# Patient Record
Sex: Male | Born: 1950 | Race: White | Hispanic: No | Marital: Married | Smoking: Former smoker
Health system: Southern US, Community
[De-identification: ages and names within clinical notes are randomized; demographics above are authoritative.]

## PROBLEM LIST (undated history)

## (undated) DIAGNOSIS — I10 Essential (primary) hypertension: Secondary | ICD-10-CM

## (undated) DIAGNOSIS — M1990 Unspecified osteoarthritis, unspecified site: Secondary | ICD-10-CM

## (undated) DIAGNOSIS — E559 Vitamin D deficiency, unspecified: Secondary | ICD-10-CM

## (undated) DIAGNOSIS — K635 Polyp of colon: Secondary | ICD-10-CM

## (undated) DIAGNOSIS — C801 Malignant (primary) neoplasm, unspecified: Secondary | ICD-10-CM

## (undated) DIAGNOSIS — R7303 Prediabetes: Secondary | ICD-10-CM

## (undated) DIAGNOSIS — J309 Allergic rhinitis, unspecified: Secondary | ICD-10-CM

## (undated) DIAGNOSIS — E785 Hyperlipidemia, unspecified: Secondary | ICD-10-CM

## (undated) DIAGNOSIS — K5790 Diverticulosis of intestine, part unspecified, without perforation or abscess without bleeding: Secondary | ICD-10-CM

## (undated) HISTORY — PX: KNEE ARTHROSCOPY: SUR90

---

## 2001-09-30 ENCOUNTER — Ambulatory Visit (HOSPITAL_BASED_OUTPATIENT_CLINIC_OR_DEPARTMENT_OTHER): Admission: RE | Admit: 2001-09-30 | Discharge: 2001-09-30 | Payer: Self-pay | Admitting: Orthopaedic Surgery

## 2004-09-14 ENCOUNTER — Inpatient Hospital Stay (HOSPITAL_COMMUNITY): Admission: RE | Admit: 2004-09-14 | Discharge: 2004-09-17 | Payer: Self-pay | Admitting: Orthopaedic Surgery

## 2006-04-04 ENCOUNTER — Ambulatory Visit (HOSPITAL_COMMUNITY): Admission: RE | Admit: 2006-04-04 | Discharge: 2006-04-04 | Payer: Self-pay | Admitting: Urology

## 2006-04-15 ENCOUNTER — Ambulatory Visit: Payer: Self-pay | Admitting: Oncology

## 2006-04-18 ENCOUNTER — Encounter (HOSPITAL_COMMUNITY): Admission: RE | Admit: 2006-04-18 | Discharge: 2006-07-17 | Payer: Self-pay | Admitting: Urology

## 2006-04-29 ENCOUNTER — Ambulatory Visit: Admission: RE | Admit: 2006-04-29 | Discharge: 2006-08-21 | Payer: Self-pay | Admitting: Radiation Oncology

## 2006-07-22 ENCOUNTER — Ambulatory Visit: Admission: RE | Admit: 2006-07-22 | Discharge: 2006-08-21 | Payer: Self-pay | Admitting: Radiation Oncology

## 2010-06-09 NOTE — Op Note (Signed)
NAME:  Steve [name], Steve [name]                ACCOUNT NO.:  [id number]   MEDICAL RECORD NO.:  [id number]          PATIENT TYPE:  INP   LOCATION:  2866                         FACILITY:  [facility]   PHYSICIAN:  Lubertha Basque. Dalldorf, M.D.DATE OF BIRTH:  [birth year]   DATE OF PROCEDURE:  09/14/2004  DATE OF DISCHARGE:                                 OPERATIVE REPORT   PREOPERATIVE DIAGNOSIS:  Left knee degenerative arthritis.   POSTOPERATIVE DIAGNOSIS:  Left knee degenerative arthritis.   PROCEDURE:  Left total knee replacement.   ANESTHESIA:  General and femoral nerve block.   ATTENDING SURGEON:  Lubertha Basque. Jerl Santos, M.D.   FIRST ASSISTANT:  1.  Charlesetta Shanks, M.D. and [place], [place].   INDICATIONS FOR PROCEDURE:  The patient is a 60 year old male with long  history of bilateral knee pain and x-rays consistent with end-stage  degenerative change of medial compartment with some mild varus deformities.  He has persisted with difficulty walking and resting despite use of oral  anti-inflammatories and multiple injectables. He has pain which limits his  lifestyle and he is offered knee replacement surgery at this time with the  first side being the right.  We intend to perform surgery the opposite side  eventually. Informed operative consent was obtained after discussion of  possible complications of reaction to anesthesia, infection, DVT, PE, and  death.   DESCRIPTION OF PROCEDURE:  The patient was taken to an  operating suite  where general anesthetic was applied without difficulty.  He was also given  a block in the preanesthesia area. He was positioned supine and prepped and  draped in normal sterile fashion. After administration of preop IV  antibiotics, the left leg was elevated, exsanguinated, tourniquet inflated  about the thigh. All appropriate anti-infective measures were used including  preoperative IV antibiotic, Betadine impregnated drape, and closed hooded  exhaust systems for each member of  the surgical team. A longitudinal  anterior incision was made with dissection down the extensor mechanism. A  medial parapatellar incision was made in this structure. The kneecap was  then flipped and the knee flexed. He had severe degenerative change actually  in all three compartments and excellent bone quality. The ACL and PCL were  removed. Some residual meniscal tissues were removed. A guide was placed on  the tibia in order to create a cut roughly parallel with the floor with a  slight posterior tilt. An intramedullary guide was then placed in the femur  in order to create anterior and posterior cuts, making a flexion gap of  about 10 mm. A second intramedullary guide was placed in the femur order to  make a distal cut creating an equal extension gap of 10 mm balancing the  knee. The femur sized to a large component while the tibia sized to a 4.  Appropriate guides placed here and utilized. The patella was cut down in  thickness from 25 to about 16 and sized to a 41 with the appropriate guide  placed and utilized. A trial reduction was done with all these components.  He easily came to slight hyperextension  and flexed well. The kneecap tracked  well and no lateral release was required. The trial components were removed  followed by pulsatile lavage irrigation of all three cut bony surfaces.  Cement was mixed including antibiotic. This was pressurized onto the three  bones. This followed by placement of the DePuy LCS components which were  large left femur, size 4 tibia, large 10 mm deep dish insert, and 41 mm all  polyethylene patella. Excess cement was trimmed and pressure was held on the  components until cement hardened. The tourniquet was then deflated and a  small amount bleeding was easily controlled with Bovie cautery. The wound  was irrigated followed by placement of a drain exiting superolaterally. The  extensor mechanism was reapproximated with #1 Vicryl interrupted fashion   followed by subcutaneous reapproximation with 0 and 2-0 undyed Vicryl and  skin closure with staples. The knee easily flexed to 120 degrees against  gravity at the end of the case and he came to slight have hyperextension as  well. Adaptic was applied to the wound followed by dry gauze and loose Ace  wrap. Estimated blood loss was 50 mL and intraoperative fluids can be  obtained from anesthesia records as can accurate tourniquet time.   DISPOSITION:  The patient was extubated in the operating room and taken to  recovery in stable condition. Plans were for him to be admitted orthopedic  surgery service for appropriate postop care to include perioperative  antibiotics and Coumadin plus Lovenox for DVT prophylaxis.      Lubertha Basque Jerl Santos, M.D.  Electronically Signed     PGD/MEDQ  D:  09/14/2004  T:  09/15/2004  Job:  [id number]

## 2010-06-09 NOTE — Op Note (Signed)
NAME:  Steve [name], Steve [name]                            ACCOUNT NO.:  [id number]   MEDICAL RECORD NO.:  [id number]                   PATIENT TYPE:  AMB   LOCATION:  DSC                                  FACILITY:  [facility]   PHYSICIAN:  Lubertha Basque. Jerl Santos, M.D.             DATE OF BIRTH:  Apr 09, 1950   DATE OF PROCEDURE:  09/30/2001  DATE OF DISCHARGE:                                 OPERATIVE REPORT   PREOPERATIVE DIAGNOSIS:  Bilateral knee degenerative joint disease and torn  meniscus.   POSTOPERATIVE DIAGNOSIS::  Bilateral degenerative joint disease and torn  meniscus.   PROCEDURES:  1. Right knee partial medial meniscectomy.  2. Right knee chondroplasty.  3. Left knee partial medial meniscectomy.  4. Left knee chondroplasty.  5. Left knee removal of loose body.   ANESTHESIA:  General.   SURGEON:  Lubertha Basque. Jerl Santos, M.D.   ASSISTANT:  Prince Rome, P.A.   INDICATION FOR PROCEDURE::  The patient is a 60 year old man with a many-  year history of bilateral knee pain and swelling.  This has persisted  despite oral anti-inflammatories and multiple injections of cortisone and  Synvisc.  He has been left with mechanical symptoms in his knee, which  interrupt his sleep and activity.  He is offered an arthroscopy on both  sides.  The procedures were discussed with the patient and informed  operative consent was obtained after discussion of the possible  complications of, reaction to anesthesia, and infection.   DESCRIPTION OF PROCEDURE:  The patient was taken to the operating suite,  where a general anesthetic was applied without difficulty.  He was  positioned supine and prepped and draped in normal sterile fashion.  After  the administration of preop IV antibiotics, an arthroscopy of the right knee  was performed through two inferior portals.  The suprapatellar pouch was  benign, although the patellofemoral joint exhibited some grade 1 and 2  chondromalacia.  In the medial  compartment he had end-stage degenerative  change, grade 4, on both aspects.  He had torn medial meniscus, which was  addressed with a partial medial meniscectomy and taking about 10% of the  structure back to a stable rim.  ACL was intact.  Lateral compartment  exhibited grade 3 and small areas of grade 4 change with an intact meniscus.  The knee was thoroughly irrigated, followed by placement of Marcaine with  epinephrine and morphine.  I also added some Depo-Medrol.  It should be  noted that a chondroplasty was done of all three compartments on this side.  We then turned out attention to the left knee, and a nearly identical  procedure was performed here through two inferior portals.  On this side he  had identical findings, but he also had a large loose body in the lateral  compartment, addressed with removal.  This knee was thoroughly irrigated and  injected with similar agents.  Adaptic was placed over the two portals,  followed by dry gauze and loose Ace wraps.  Estimated blood loss and  intraoperative fluids can be obtained from the anesthesia records.    DISPOSITION:  The patient was extubated in the operating room and taken to  the recovery room in stable condition.  The plan is for him to go home the  same day and follow up in the office in less than a week.  I will contact  him by phone tonight.                                                Lubertha Basque Jerl Santos, M.D.    PGD/MEDQ  D:  09/30/2001  T:  09/30/2001  Job:  [id number]

## 2010-06-09 NOTE — Discharge Summary (Signed)
NAME:  Steve [name], Steve [name]                ACCOUNT NO.:  [id number]   MEDICAL RECORD NO.:  [id number]          PATIENT TYPE:  INP   LOCATION:  5021                         FACILITY:  [facility]   PHYSICIAN:  Steve Basque. Dalldorf, M.D.DATE OF BIRTH:  [birth year]   DATE OF ADMISSION:  09/14/2004  DATE OF DISCHARGE:  09/17/2004                                 DISCHARGE SUMMARY   ADMISSION DIAGNOSES:  1.  Left knee end-stage degenerative joint disease.  2.  Hypertension.  3.  Hypercholesterolemia.   DISCHARGE DIAGNOSES:  1.  Left knee end-stage degenerative joint disease.  2.  Hypertension.  3.  Hypercholesterolemia.  4.  Deep venous thrombosis prophylaxis with Coumadin.   BRIEF HISTORY:  Steve [name] is a 60 year old white male who is a patient well  known to our practice. He is complaining of bilateral knee pain, but the  left is greater than the right. He has had knee arthroscopies done in the  past which did reveal some significant degenerative change and over time his  symptoms have worsened. Recent x-ray show end-stage DJD of his left knee  worse than his right knee. He is now having pain with every step, trouble  sleeping at night time. He has failed Synvisc and corticosteroid injections.  We have discussed treatment options with him that being knee replacement  surgery and the risks of anesthesia, infection, DVT, and possible death.   PERTINENT LABORATORY AND X-RAY FINDINGS:  Normal sinus rhythm on EKG. WBC  8.5, hemoglobin 11.7, hematocrit 33.3, platelet count 221,000. INR 1.4.  Sodium 135, potassium 3.7, glucose 130, BUN 14, creatinine 1.1.   COURSE IN THE HOSPITAL:  The patient was admitted postoperatively. He was on  an IV of lactated ringers, IV Ancef 1 gm q.6h. times three doses. Colace,  Trinsicon, laxative of choice were all ordered. Coumadin and Lovenox  protocol per pharmacy for DVT prophylaxis. He was also put on oral pain  medication, Percocet and Phenergan for nausea, Skelaxin  for muscle  relaxation if necessary, and kept on some medications of lisinopril 20/25  and lovastatin 20 mg. Ice to his left knee, incentive spirometry, knee-high  TEDs, and in-and-out catheter as needed. Knee immobilizer to his left knee  when necessary, particularly out of bed and walking. CPM machine 0-50 and  advance as tolerated. Follow-up lab studies as described. The first day  postoperatively his vital signs were stable with blood pressure of 108/61,  heart rate 70, temperature 97.1. Hemoglobin 12.9, WBC 12.3, and an INR of 1.  Lungs were clear with breath sounds in all fields. Abdomen was soft with  positive bowel sounds. Dressing was okay. No sign of drainage. Good nausea  and vomiting status. No sign of infection. He was using a CPM and was on  anticoagulation per pharmacy. They felt that he was on course for discharge  and a home agency would be contacted for help with Coumadin and physical  therapy. The second day postoperatively his blood pressure was 107/66,  temperature 99, hemoglobin 11.7, WBCs down to 8.5, INR 1.3. Lungs continued  to be clear. The abdomen  was soft with positive bowel sounds. The dressing  was changed and the drain was pulled. The wound was benign with no sign of  infection. I continued working on his CPM and we discontinued his IV and PCA  at that time. The third day postoperatively his vital signs were stable as  was temperature maximum to 101 that night, but was back to within normal  limits. Wound was benign and INR was 1.4. No sign of infection or  irritation. He was discharged home.   CONDITION ON DISCHARGE:  Improved.   MEDICATIONS:  He will be discharged home on his lisinopril 20/25 one a day,  lovastatin 20 mg two a day, prescription for Vicodin given one or two q.4-  6h. p.r.n. pain p.r.n., Coumadin as directed by pharmacy (initially was two  tablets 10 mg daily). May recommend to have a protime on September 18, 2004.  [facility] was the  home agency that would that draw that protime and  also provide home physical therapy. He is to be back to see Steve [name] in  seven to ten days, to call [phone]. He is to keep his dressing dry. If any  sign of infection to call [phone]. He has no restrictions on his diet, no  driving for two weeks, crutches or walker.      Steve [name], P.A.      Steve [name], M.D.  Electronically Signed    MC/MEDQ  D:  11/17/2004  T:  11/17/2004  Job:  [id number]

## 2012-09-30 ENCOUNTER — Inpatient Hospital Stay (HOSPITAL_COMMUNITY): Payer: 59

## 2012-09-30 ENCOUNTER — Emergency Department (HOSPITAL_COMMUNITY): Payer: 59

## 2012-09-30 ENCOUNTER — Ambulatory Visit (HOSPITAL_COMMUNITY): Admit: 2012-09-30 | Payer: 59 | Admitting: Cardiovascular Disease

## 2012-09-30 ENCOUNTER — Encounter (HOSPITAL_COMMUNITY): Payer: Self-pay | Admitting: *Deleted

## 2012-09-30 ENCOUNTER — Encounter (HOSPITAL_COMMUNITY)
Admission: EM | Disposition: A | Discharge status: Other Acute Inpatient Hospital | Payer: Self-pay | Source: Home / Self Care | Attending: Pulmonary Disease

## 2012-09-30 ENCOUNTER — Inpatient Hospital Stay (HOSPITAL_COMMUNITY)
Admission: EM | Admit: 2012-09-30 | Discharge: 2012-10-01 | DRG: 237 | Disposition: A | Discharge status: Other Acute Inpatient Hospital | Payer: 59 | Attending: Internal Medicine | Admitting: Internal Medicine

## 2012-09-30 DIAGNOSIS — J9600 Acute respiratory failure, unspecified whether with hypoxia or hypercapnia: Secondary | ICD-10-CM | POA: Diagnosis not present

## 2012-09-30 DIAGNOSIS — E872 Acidosis, unspecified: Secondary | ICD-10-CM

## 2012-09-30 DIAGNOSIS — N179 Acute kidney failure, unspecified: Secondary | ICD-10-CM

## 2012-09-30 DIAGNOSIS — I409 Acute myocarditis, unspecified: Secondary | ICD-10-CM

## 2012-09-30 DIAGNOSIS — I959 Hypotension, unspecified: Secondary | ICD-10-CM

## 2012-09-30 DIAGNOSIS — I214 Non-ST elevation (NSTEMI) myocardial infarction: Secondary | ICD-10-CM

## 2012-09-30 DIAGNOSIS — I472 Ventricular tachycardia, unspecified: Secondary | ICD-10-CM

## 2012-09-30 DIAGNOSIS — I498 Other specified cardiac arrhythmias: Secondary | ICD-10-CM | POA: Diagnosis present

## 2012-09-30 DIAGNOSIS — J069 Acute upper respiratory infection, unspecified: Secondary | ICD-10-CM

## 2012-09-30 DIAGNOSIS — I428 Other cardiomyopathies: Secondary | ICD-10-CM | POA: Diagnosis present

## 2012-09-30 DIAGNOSIS — I4729 Other ventricular tachycardia: Secondary | ICD-10-CM | POA: Diagnosis present

## 2012-09-30 DIAGNOSIS — E871 Hypo-osmolality and hyponatremia: Secondary | ICD-10-CM | POA: Diagnosis present

## 2012-09-30 DIAGNOSIS — I951 Orthostatic hypotension: Secondary | ICD-10-CM | POA: Diagnosis present

## 2012-09-30 DIAGNOSIS — E559 Vitamin D deficiency, unspecified: Secondary | ICD-10-CM | POA: Diagnosis present

## 2012-09-30 DIAGNOSIS — M1710 Unilateral primary osteoarthritis, unspecified knee: Secondary | ICD-10-CM | POA: Diagnosis present

## 2012-09-30 DIAGNOSIS — R55 Syncope and collapse: Secondary | ICD-10-CM | POA: Diagnosis present

## 2012-09-30 DIAGNOSIS — E119 Type 2 diabetes mellitus without complications: Secondary | ICD-10-CM | POA: Diagnosis present

## 2012-09-30 DIAGNOSIS — J9601 Acute respiratory failure with hypoxia: Secondary | ICD-10-CM

## 2012-09-30 DIAGNOSIS — R570 Cardiogenic shock: Secondary | ICD-10-CM

## 2012-09-30 DIAGNOSIS — G9340 Encephalopathy, unspecified: Secondary | ICD-10-CM | POA: Diagnosis present

## 2012-09-30 DIAGNOSIS — I10 Essential (primary) hypertension: Secondary | ICD-10-CM | POA: Diagnosis present

## 2012-09-30 DIAGNOSIS — I442 Atrioventricular block, complete: Principal | ICD-10-CM | POA: Diagnosis present

## 2012-09-30 DIAGNOSIS — C61 Malignant neoplasm of prostate: Secondary | ICD-10-CM | POA: Diagnosis present

## 2012-09-30 DIAGNOSIS — E785 Hyperlipidemia, unspecified: Secondary | ICD-10-CM | POA: Diagnosis present

## 2012-09-30 DIAGNOSIS — I401 Isolated myocarditis: Secondary | ICD-10-CM | POA: Diagnosis present

## 2012-09-30 DIAGNOSIS — I2510 Atherosclerotic heart disease of native coronary artery without angina pectoris: Secondary | ICD-10-CM | POA: Diagnosis present

## 2012-09-30 DIAGNOSIS — K7200 Acute and subacute hepatic failure without coma: Secondary | ICD-10-CM | POA: Diagnosis not present

## 2012-09-30 DIAGNOSIS — I509 Heart failure, unspecified: Secondary | ICD-10-CM | POA: Diagnosis present

## 2012-09-30 DIAGNOSIS — E876 Hypokalemia: Secondary | ICD-10-CM | POA: Diagnosis present

## 2012-09-30 DIAGNOSIS — I5021 Acute systolic (congestive) heart failure: Secondary | ICD-10-CM | POA: Diagnosis present

## 2012-09-30 HISTORY — DX: Unspecified osteoarthritis, unspecified site: M19.90

## 2012-09-30 HISTORY — DX: Essential (primary) hypertension: I10

## 2012-09-30 HISTORY — DX: Malignant (primary) neoplasm, unspecified: C80.1

## 2012-09-30 HISTORY — DX: Prediabetes: R73.03

## 2012-09-30 HISTORY — PX: TEMPORARY PACEMAKER INSERTION: SHX5471

## 2012-09-30 HISTORY — DX: Allergic rhinitis, unspecified: J30.9

## 2012-09-30 HISTORY — DX: Diverticulosis of intestine, part unspecified, without perforation or abscess without bleeding: K57.90

## 2012-09-30 HISTORY — DX: Vitamin D deficiency, unspecified: E55.9

## 2012-09-30 HISTORY — DX: Polyp of colon: K63.5

## 2012-09-30 HISTORY — DX: Hyperlipidemia, unspecified: E78.5

## 2012-09-30 LAB — BASIC METABOLIC PANEL
CO2: 20 mEq/L (ref 19–32)
Calcium: 7.9 mg/dL — ABNORMAL LOW (ref 8.4–10.5)
Chloride: 90 mEq/L — ABNORMAL LOW (ref 96–112)
Sodium: 126 mEq/L — ABNORMAL LOW (ref 135–145)

## 2012-09-30 LAB — COMPREHENSIVE METABOLIC PANEL
ALT: 83 U/L — ABNORMAL HIGH (ref 0–53)
AST: 274 U/L — ABNORMAL HIGH (ref 0–37)
AST: 274 U/L — ABNORMAL HIGH (ref 0–37)
Albumin: 3.1 g/dL — ABNORMAL LOW (ref 3.5–5.2)
Alkaline Phosphatase: 41 U/L (ref 39–117)
Alkaline Phosphatase: 40 U/L (ref 39–117)
Calcium: 8.2 mg/dL — ABNORMAL LOW (ref 8.4–10.5)
Chloride: 88 mEq/L — ABNORMAL LOW (ref 96–112)
Creatinine, Ser: 2.01 mg/dL — ABNORMAL HIGH (ref 0.50–1.35)
GFR calc Af Amer: 39 mL/min — ABNORMAL LOW (ref >90)
GFR calc non Af Amer: 34 mL/min — ABNORMAL LOW (ref >90)
Potassium: 3.2 mEq/L — ABNORMAL LOW (ref 3.5–5.1)
Sodium: 127 mEq/L — ABNORMAL LOW (ref 135–145)
Sodium: 128 mEq/L — ABNORMAL LOW (ref 135–145)
Total Bilirubin: 0.5 mg/dL (ref 0.3–1.2)
Total Bilirubin: 0.4 mg/dL (ref 0.3–1.2)
Total Protein: 6.3 g/dL (ref 6.0–8.3)

## 2012-09-30 LAB — CBC WITH DIFFERENTIAL/PLATELET
Basophils Absolute: 0 10*3/uL (ref 0.0–0.1)
Basophils Relative: 0 % (ref 0–1)
Eosinophils Absolute: 0 10*3/uL (ref 0.0–0.7)
Eosinophils Relative: 0 % (ref 0–5)
HCT: 36.4 % — ABNORMAL LOW (ref 39.0–52.0)
HCT: 34.4 % — ABNORMAL LOW (ref 39.0–52.0)
Hemoglobin: 12 g/dL — ABNORMAL LOW (ref 13.0–17.0)
Lymphocytes Relative: 23 % (ref 12–46)
Lymphocytes Relative: 14 % (ref 12–46)
Lymphs Abs: 1.2 10*3/uL (ref 0.7–4.0)
MCV: 87.3 fL (ref 78.0–100.0)
Monocytes Absolute: 0.9 10*3/uL (ref 0.1–1.0)
RBC: 4.14 MIL/uL — ABNORMAL LOW (ref 4.22–5.81)
RBC: 3.94 MIL/uL — ABNORMAL LOW (ref 4.22–5.81)
WBC: 9 10*3/uL (ref 4.0–10.5)

## 2012-09-30 LAB — MAGNESIUM: Magnesium: 2 mg/dL (ref 1.5–2.5)

## 2012-09-30 LAB — HEMOGLOBIN A1C: Hgb A1c MFr Bld: 6.3 % — ABNORMAL HIGH (ref <5.7)

## 2012-09-30 LAB — URINALYSIS, ROUTINE W REFLEX MICROSCOPIC
Ketones, ur: NEGATIVE mg/dL
Leukocytes, UA: NEGATIVE
Nitrite: NEGATIVE
Protein, ur: NEGATIVE mg/dL

## 2012-09-30 LAB — PROTIME-INR: INR: 1.13 (ref 0.00–1.49)

## 2012-09-30 LAB — URINE MICROSCOPIC-ADD ON

## 2012-09-30 LAB — APTT: aPTT: 32 seconds (ref 24–37)

## 2012-09-30 LAB — TSH: TSH: 3.878 u[IU]/mL (ref 0.350–4.500)

## 2012-09-30 LAB — GLUCOSE, CAPILLARY: Glucose-Capillary: 153 mg/dL — ABNORMAL HIGH (ref 70–99)

## 2012-09-30 LAB — PRO B NATRIURETIC PEPTIDE: Pro B Natriuretic peptide (BNP): 28521 pg/mL — ABNORMAL HIGH (ref 0–125)

## 2012-09-30 LAB — CG4 I-STAT (LACTIC ACID): Lactic Acid, Venous: 6.58 mmol/L — ABNORMAL HIGH (ref 0.5–2.2)

## 2012-09-30 LAB — PROCALCITONIN: Procalcitonin: 0.89 ng/mL

## 2012-09-30 SURGERY — TEMPORARY PACEMAKER INSERTION
Anesthesia: LOCAL

## 2012-09-30 MED ORDER — HEPARIN SODIUM (PORCINE) 5000 UNIT/ML IJ SOLN
5000.0000 [IU] | Freq: Three times a day (TID) | INTRAMUSCULAR | Status: DC
  Administered 2012-09-30 (×2): 5000 [IU] via SUBCUTANEOUS
  Filled 2012-09-30 (×6): qty 1

## 2012-09-30 MED ORDER — DEXTROSE 5 % IV SOLN
1.0000 g | Freq: Once | INTRAVENOUS | Status: AC
  Administered 2012-09-30: 1 g via INTRAVENOUS
  Filled 2012-09-30: qty 10

## 2012-09-30 MED ORDER — SODIUM CHLORIDE 0.9 % IJ SOLN
3.0000 mL | Freq: Two times a day (BID) | INTRAMUSCULAR | Status: DC
  Administered 2012-09-30: 3 mL via INTRAVENOUS

## 2012-09-30 MED ORDER — POTASSIUM CHLORIDE 10 MEQ/100ML IV SOLN
INTRAVENOUS | Status: AC
  Administered 2012-09-30: 10 meq
  Filled 2012-09-30: qty 100

## 2012-09-30 MED ORDER — SODIUM CHLORIDE 0.9 % IV SOLN
1000.0000 mL | Freq: Once | INTRAVENOUS | Status: AC
  Administered 2012-09-30: 1000 mL via INTRAVENOUS

## 2012-09-30 MED ORDER — SODIUM BICARBONATE 8.4 % IV SOLN
50.0000 meq | Freq: Once | INTRAVENOUS | Status: AC
  Administered 2012-09-30: 50 meq via INTRAVENOUS

## 2012-09-30 MED ORDER — ALBUTEROL SULFATE (5 MG/ML) 0.5% IN NEBU: 2.5000 mg | INHALATION_SOLUTION | Freq: Four times a day (QID) | RESPIRATORY_TRACT | Status: DC

## 2012-09-30 MED ORDER — POTASSIUM CHLORIDE 10 MEQ/100ML IV SOLN
10.0000 meq | INTRAVENOUS | Status: AC
  Administered 2012-09-30: 10 meq via INTRAVENOUS
  Filled 2012-09-30: qty 100

## 2012-09-30 MED ORDER — IPRATROPIUM BROMIDE 0.02 % IN SOLN
RESPIRATORY_TRACT | Status: AC
  Administered 2012-09-30: 0.5 mg
  Filled 2012-09-30: qty 2.5

## 2012-09-30 MED ORDER — SODIUM CHLORIDE 0.9 % IV SOLN
INTRAVENOUS | Status: DC
  Administered 2012-09-30: 22:00:00 via INTRAVENOUS

## 2012-09-30 MED ORDER — ACETAMINOPHEN 325 MG PO TABS: 650.0000 mg | ORAL_TABLET | ORAL | Status: DC | PRN

## 2012-09-30 MED ORDER — SODIUM CHLORIDE 0.9 % IV BOLUS (SEPSIS)
250.0000 mL | Freq: Once | INTRAVENOUS | Status: AC
  Administered 2012-09-30: 250 mL via INTRAVENOUS

## 2012-09-30 MED ORDER — ZOLPIDEM TARTRATE 5 MG PO TABS: 5.0000 mg | ORAL_TABLET | Freq: Every evening | ORAL | Status: DC | PRN

## 2012-09-30 MED ORDER — SODIUM BICARBONATE 8.4 % IV SOLN
INTRAVENOUS | Status: AC
  Filled 2012-09-30: qty 50

## 2012-09-30 MED ORDER — MORPHINE SULFATE 2 MG/ML IJ SOLN
2.0000 mg | INTRAMUSCULAR | Status: DC | PRN
  Filled 2012-09-30: qty 1

## 2012-09-30 MED ORDER — ALBUTEROL SULFATE (5 MG/ML) 0.5% IN NEBU
INHALATION_SOLUTION | RESPIRATORY_TRACT | Status: AC
  Administered 2012-09-30: 2.5 mg
  Filled 2012-09-30: qty 0.5

## 2012-09-30 MED ORDER — FENTANYL CITRATE 0.05 MG/ML IJ SOLN: 50.0000 ug | Freq: Once | INTRAMUSCULAR | Status: DC

## 2012-09-30 MED ORDER — ONDANSETRON HCL 4 MG/2ML IJ SOLN: 4.0000 mg | Freq: Four times a day (QID) | INTRAMUSCULAR | Status: DC | PRN

## 2012-09-30 MED ORDER — SODIUM CHLORIDE 0.9 % IV SOLN: 250.0000 mL | INTRAVENOUS | Status: DC | PRN

## 2012-09-30 MED ORDER — ALBUTEROL SULFATE (5 MG/ML) 0.5% IN NEBU
2.5000 mg | INHALATION_SOLUTION | RESPIRATORY_TRACT | Status: DC | PRN
  Administered 2012-10-01: 2.5 mg via RESPIRATORY_TRACT
  Filled 2012-09-30: qty 0.5

## 2012-09-30 MED ORDER — DOPAMINE-DEXTROSE 3.2-5 MG/ML-% IV SOLN
5.0000 ug/kg/min | Freq: Once | INTRAVENOUS | Status: AC
  Administered 2012-09-30: 5 ug/kg/min via INTRAVENOUS

## 2012-09-30 MED ORDER — DOPAMINE-DEXTROSE 3.2-5 MG/ML-% IV SOLN
INTRAVENOUS | Status: AC
  Administered 2012-09-30: 5 ug/kg/min
  Filled 2012-09-30: qty 250

## 2012-09-30 MED ORDER — IPRATROPIUM BROMIDE 0.02 % IN SOLN: 0.5000 mg | Freq: Four times a day (QID) | RESPIRATORY_TRACT | Status: DC

## 2012-09-30 MED ORDER — NITROGLYCERIN 0.4 MG SL SUBL: 0.4000 mg | SUBLINGUAL_TABLET | SUBLINGUAL | Status: DC | PRN

## 2012-09-30 MED ORDER — ONDANSETRON HCL 4 MG/2ML IJ SOLN
4.0000 mg | Freq: Four times a day (QID) | INTRAMUSCULAR | Status: DC | PRN
  Filled 2012-09-30: qty 2

## 2012-09-30 MED ORDER — DEXTROSE 5 % IV SOLN
500.0000 mg | Freq: Once | INTRAVENOUS | Status: AC
  Administered 2012-09-30: 500 mg via INTRAVENOUS
  Filled 2012-09-30: qty 500

## 2012-09-30 MED ORDER — DOPAMINE-DEXTROSE 3.2-5 MG/ML-% IV SOLN: 5.0000 ug/kg/min | Freq: Once | INTRAVENOUS | Status: DC

## 2012-09-30 MED ORDER — MIDAZOLAM HCL 2 MG/2ML IJ SOLN
INTRAMUSCULAR | Status: AC
  Filled 2012-09-30: qty 2

## 2012-09-30 MED ORDER — FENTANYL CITRATE 0.05 MG/ML IJ SOLN
INTRAMUSCULAR | Status: AC
  Filled 2012-09-30: qty 2

## 2012-09-30 MED ORDER — INFLUENZA VAC SPLIT QUAD 0.5 ML IM SUSP
0.5000 mL | INTRAMUSCULAR | Status: DC
  Filled 2012-09-30 (×2): qty 0.5

## 2012-09-30 MED ORDER — SODIUM CHLORIDE 0.9 % IV SOLN
1.0000 g | Freq: Once | INTRAVENOUS | Status: DC
  Filled 2012-09-30: qty 10

## 2012-09-30 MED ORDER — MIDAZOLAM HCL 5 MG/ML IJ SOLN: 4.0000 mg | Freq: Once | INTRAMUSCULAR | Status: DC

## 2012-09-30 MED ORDER — LORAZEPAM 2 MG/ML IJ SOLN
INTRAMUSCULAR | Status: AC
  Filled 2012-09-30: qty 1

## 2012-09-30 MED ORDER — ASPIRIN EC 81 MG PO TBEC
81.0000 mg | DELAYED_RELEASE_TABLET | Freq: Every day | ORAL | Status: DC
  Filled 2012-09-30: qty 1

## 2012-09-30 MED ORDER — SODIUM CHLORIDE 0.9 % IJ SOLN: 3.0000 mL | INTRAMUSCULAR | Status: DC | PRN

## 2012-09-30 MED ORDER — ATROPINE SULFATE 0.1 MG/ML IJ SOLN
INTRAMUSCULAR | Status: AC
  Filled 2012-09-30: qty 10

## 2012-09-30 NOTE — Consult Note (Addendum)
PULMONARY  / CRITICAL CARE MEDICINE  Name: Steve [name] MRN: [id number] DOB: 27-Oct-1950    ADMISSION DATE:  October 03, 2012 CONSULTATION DATE:  2022-10-04  REFERRING MD :  Turner/Cardiology PRIMARY SERVICE: Cards  Reasons for consultation: lactic acidosis, r/o infectious cause  BRIEF PATIENT DESCRIPTION: 37 M seen by primary MD with 3 days of viral-type prodrome. Suffered presyncope and hypotension and sent to ED. In ED found to be bradycardic which progressed to complete heart block. Taken immediately for temp PM placement. PCCM asked to assess for lactic acidosis on admission labs and multiple other lab abnormalities. Initial troponin I > 20 and BNP > 28,000  SIGNIFICANT EVENTS / STUDIES:  2022-10-04 Temp PM placement 2022-10-04 Echo:   LINES / TUBES:   CULTURES: Blood 10/04/2022 >>   ANTIBIOTICS: Ceftriaxone Oct 04, 2022 (X1) Azithro 10/04/2022 (X 1)  HISTORY OF PRESENT ILLNESS:  As above. Pt suffered 3 days of upper resp symptoms, nausea, anorexia, malaise and weakness. He also had subjective fevers. Prior to onset of these symptoms, he was in his USOH. Presently on dopamine infusion with sinus rhythm @ approx 80/min  PAST MEDICAL HISTORY :  Past Medical History  Diagnosis Date  . Hypertension   . Hyperlipidemia   . Prediabetes   . Cancer     prostate CA s/p seed implant  . Dyslipidemia   . Vitamin D deficiency   . Allergic rhinitis   . Arthritis     OA of knee  . Colon polyps   . Diverticulosis    Past Surgical History  Procedure Laterality Date  . Knee arthroscopy     Prior to Admission medications   Medication Sig Start Date End Date Taking? Authorizing Provider  atorvastatin (LIPITOR) 40 MG tablet Take 40 mg by mouth daily.   Yes Historical Provider, MD  lisinopril-hydrochlorothiazide (PRINZIDE,ZESTORETIC) 20-25 MG per tablet Take 1 tablet by mouth daily.   Yes Historical Provider, MD  naproxen sodium (ANAPROX) 220 MG tablet Take 220 mg by mouth 2 (two) times daily with a meal.   Yes Historical  Provider, MD   Allergies  Allergen Reactions  . Simvastatin     FAMILY HISTORY:  History reviewed. No pertinent family history. SOCIAL HISTORY:  reports that he quit smoking about 3 weeks ago. His smoking use included Cigarettes. He has a 12.5 pack-year smoking history. He has never used smokeless tobacco. He reports that he does not drink alcohol or use illicit drugs.  REVIEW OF SYSTEMS:  As per HPI. Denies chest pain, purulent sputum, hemoptysis, orthopnea, PND, dysuria, LE edema and calf tenderness  SUBJECTIVE:   VITAL SIGNS: Temp:  [97.5 F (36.4 C)-98 F (36.7 C)] 97.5 F (36.4 C) 10/04/22 2000) Pulse Rate:  [30-79] 70 04-Oct-2022 2000) Resp:  [15-25] 25 04-Oct-2022 2000) BP: (83-113)/(58-72) 93/72 mmHg 10/04/22 2000) SpO2:  [91 %-100 %] 94 % Oct 04, 2022 2000) Weight:  [102.7 kg (226 lb 6.6 oz)] 102.7 kg (226 lb 6.6 oz) October 04, 2022 1942) HEMODYNAMICS:   VENTILATOR SETTINGS:   INTAKE / OUTPUT: Intake/Output     2022/10/04 0701 - 09/10 0700   IV Piggyback 50   Total Intake(mL/kg) 50 (0.5)   Urine (mL/kg/hr) 100   Total Output 100   Net -50       Urine Occurrence 2 x     PHYSICAL EXAMINATION: General:  Pleasant, NAD Neuro:  No focal deficits HEENT:  NCAT, PERRL, EOMI Cardiovascular:  Bradycardia, regular, no M Lungs:  Coarse basilar BS with posterior wheezes Abdomen:  Soft, NABS, NT Ext:  warm, no edema  LABS:  CBC Recent Labs     09/30/12  1313  09/30/12  1826  WBC  11.8*  9.0  HGB  13.1  12.0*  HCT  36.4*  34.4*  PLT  173  158   Coag's Recent Labs     09/30/12  1449  APTT  32  INR  1.13   BMET Recent Labs     09/30/12  1313  09/30/12  1449  NA  127*  128*  K  3.3*  3.2*  CL  89*  88*  CO2  17*  15*  BUN  44*  44*  CREATININE  2.01*  2.02*  GLUCOSE  145*  145*   Electrolytes Recent Labs     09/30/12  1313  09/30/12  1449  CALCIUM  8.2*  8.3*   Sepsis Markers Recent Labs     09/30/12  1313  PROCALCITON  0.89   ABG No results found for this  basename: PHART, PCO2ART, PO2ART,  in the last 72 hours Liver Enzymes Recent Labs     09/30/12  1313  09/30/12  1449  AST  274*  274*  ALT  83*  82*  ALKPHOS  41  40  BILITOT  0.5  0.4  ALBUMIN  3.2*  3.1*   Cardiac Enzymes Recent Labs     09/30/12  1449  TROPONINI  >20.00*  PROBNP  28521.0*   Glucose Recent Labs     09/30/12  1428  GLUCAP  153*     CXR: NACPD  ASSESSMENT / PLAN:  PULMONARY A: Smoker - recently quit Wheezing P:   Nebulized BDs  Supplemental O2  CARDIOVASCULAR A: AMI Bradycardia Transient CHB S/P temp PM placement Lactic acidosis - likely due to transient shock during period of hypotension/CHB P:  Eval and mgt per Cards Recheck lactic acid  RENAL A:  Hyponatremia Hypokalemia Acute renal insuff - likely due to volume depletion, ACEI P:   Monitor BMET intermittently Correct electrolytes as indicated Hold ACEI IVFs as ordered - caution in light of elevated pro BNP  GASTROINTESTINAL A:  Nausea - seems resolved P:   Diet as ordered SUP not indicated PRN ondansetron as ordered  HEMATOLOGIC A:  No acute issues P:  Monitor  INFECTIOUS A:  URI sx and viral prodrome, R/O PNA P:   Check PCT AM 9/10 Recheck CXR AM 9/10 Decide on further abx  ENDOCRINE A:  Mild hyperglycemia without prior hx of DM P:   Monitor CBGs SSI for glu > 180  NEUROLOGIC A:  No issues P:   Monitor  TODAY'S SUMMARY:   I have personally obtained a history, examined the patient, evaluated laboratory and imaging results, formulated the assessment and plan and placed orders. CRITICAL CARE: The patient is critically ill with multiple organ systems failure and requires high complexity decision making for assessment and support, frequent evaluation and titration of therapies, application of advanced monitoring technologies and extensive interpretation of multiple databases. Critical Care Time devoted to patient care services described in this note is   minutes.   Billy Fischer, MD ; [facility] [phone].  After 5:30 PM or weekends, call [phone]  Pulmonary and Critical Care Medicine [facility] Pager: [phone]  09/30/2012, 9:30 PM

## 2012-09-30 NOTE — ED Notes (Signed)
Pt. transferred to TRAB and report given to Chitina, Charity fundraiser.  MD at bedside.

## 2012-09-30 NOTE — Progress Notes (Signed)
Rhonda Barrett PA notified of troponin level >20.00.

## 2012-09-30 NOTE — ED Provider Notes (Signed)
1500: care assumed from Dr. Elesa Massed.  Patient hypotensive, bradycardic w pallor.  Patient remains awake, though his responsiveness decreases with HR dips below 30.  Calcium gluconate and bicarb provided.    HR 35 third degree block. MAP 61  3:23 PM I discussed the patient's case with Dr. Mayford Knife.  Dopamine starting, patient remains in third degree block.  Patient remains diaphoretic, pale.   3:36 PM Patient is going to cath lab for placement of pacemaker.        Gerhard Munch, MD 09/30/12 [phone]

## 2012-09-30 NOTE — Progress Notes (Signed)
  Echocardiogram 2D Echocardiogram has been performed.  [place] 09/30/2012, 5:48 PM

## 2012-09-30 NOTE — ED Notes (Signed)
Pt. Has c/o Hypotension and general weakness since Sunday. Pt. Was noted with multiple falls at the MD's office and while getting out of the car.  BS 183.  Pt. Denies chest pain. N/v/.  Pt. Is noted with decreased lung sounds and diarrhea per EMS.

## 2012-09-30 NOTE — Progress Notes (Signed)
Chaplain responded to code in ED. Patient had stabilized when chaplain arrived. Chaplain visited family and sat with them for a while. Wife said that she had been there when the patient started to get sick. She described his symptoms and the situation. She also stated that she had just undergone a knee replacement surgery so she is also recovering from her own health problems. Chaplain practiced active listening and crisis support.

## 2012-09-30 NOTE — ED Notes (Signed)
Pt. Noted with a heart rate of 30 and sz.  Pt. Was unresponsive and blue in the face.  Dr. At bedside and pt. Placed on NRB, defibilator, and labs drawn.  Pt.'s family at bedside and updated by MD.

## 2012-09-30 NOTE — CV Procedure (Signed)
    Cardiac Catheterization Operative Report  Steve [name] [id number] 9/9/20143:59 PM Sissy Hoff, MD  Procedure Performed:  1. Placement of temporary transvenous pacemaker in the right femoral artery  Operator: Verne Carrow, MD  Arterial access site:  Right radial artery.   Indication: 62 yo male admitted with complete heart block, hypotension. He is being admitted to [facility] by Dr. Mayford Knife. I am asked to assist with placement of the temporary pacing wire.                                    Procedure Details: The risks, benefits, complications, treatment options, and expected outcomes were discussed with the patient. Emergency consent obtained.  The right groin was prepped and draped in a sterile fashion. 1% lidocaine was used for local anesthesia. Using the modified Seldinger access technique, a 6 French sheath was placed in the right femoral vein. I then placed a single electrode pacing wire in the RV. The pacemaker was set at 60 bpm.  There were no immediate complications. The patient was taken to the recovery area in stable condition.   Hemodynamic Findings: Central aortic pressure: 80/59  Impression: 1. Complete heart block 2. Successful placement of temporary transvenous pacemaker  Recommendations: He will be followed by Dr. Mayford Knife with [facility]. Plans for echo today. He will be followed closely in the CCU.        Complications:  None. The patient tolerated the procedure well.

## 2012-09-30 NOTE — ED Notes (Addendum)
Results of lactic acid given to Dr. Ward 

## 2012-09-30 NOTE — ED Provider Notes (Addendum)
TIME SEEN: 12:50 PM  CHIEF COMPLAINT: Cough with sputum production, 2 episodes of diarrhea, lightheadedness  HPI: Patient is a 62 year old male with a history of hyperlipidemia, hypertension who presents the emergency department for evaluation for cough and congestion and hypertension. Patient reports that on Saturday, 3 days ago, he developed nasal congestion and a cough with clear sputum production. He states he's had subjective fevers and chills. He was seen by urgent care on Sunday and was sent home with hydrocodone cough syrup. He was seen by his primary care physician today and was noted to be hypotensive with a blood pressure of 83/59. In the office, patient had a syncopal event per family. He reports he has not had his antihypertensives in 2 days due to his dizziness. He has not had any vomiting, bloody stools or melena. He has had 2 episodes of diarrhea. No sick contacts, recent travel. Denies any chest pain or shortness of breath.  PCP is Dr. Erling Conte [facility]  ROS: See HPI Constitutional: Subjective fever  Eyes: no drainage  ENT: no runny nose   Cardiovascular:  no chest pain  Resp: no SOB  GI: no vomiting GU: no dysuria Integumentary: no rash  Allergy: no hives  Musculoskeletal: no leg swelling  Neurological: no slurred speech ROS otherwise negative  PAST MEDICAL HISTORY/PAST SURGICAL HISTORY:  Past Medical History  Diagnosis Date  . Hypertension   . Hyperlipidemia     MEDICATIONS:  Prior to Admission medications   Not on File    ALLERGIES:  No Known Allergies  SOCIAL HISTORY:  History  Substance Use Topics  . Smoking status: Current Some Day Smoker    Types: Cigarettes  . Smokeless tobacco: Never Used  . Alcohol Use: No    FAMILY HISTORY: History reviewed. No pertinent family history.  EXAM: There were no vitals taken for this visit. CONSTITUTIONAL: Alert and oriented and responds appropriately to questions. Well-appearing;  well-nourished HEAD: Normocephalic EYES: Conjunctivae clear, PERRL ENT: normal nose; no rhinorrhea; moist mucous membranes; pharynx without lesions noted NECK: Supple, no meningismus, no LAD  CARD: RRR; S1 and S2 appreciated; no murmurs, no clicks, no rubs, no gallops RESP: Normal chest excursion without splinting or tachypnea; crackles at bases bilaterally, no wheezing or rhonchi ABD/GI: Normal bowel sounds; non-distended; soft, non-tender, no rebound, no guarding BACK:  The back appears normal and is non-tender to palpation, there is no CVA tenderness EXT: Normal ROM in all joints; non-tender to palpation; no edema; normal capillary refill; no cyanosis    SKIN: Normal color for age and race; slightly cool extremities with normal peripheral pulses NEURO: Moves all extremities equally PSYCH: The patient's mood and manner are appropriate. Grooming and personal hygiene are appropriate.  MEDICAL DECISION MAKING: Patient with subjective fever, cough, diarrhea for the past 3 days with hypotension. He is otherwise well appearing, nontoxic. Blood pressure on my evaluation was 93/59 with a heart rate in the 60s. He denies any history of cardiac disease. His EKG does show a new intraventricular conduction delay. We'll obtain cardiac labs, chest x-ray, blood in urine culture. Will empirically treat with ceftriaxone and azithromycin for community-acquired pneumonia. We'll give IV fluids. Patient will need admission to the hospital.   Date: 09/30/2012 13:03  Rate: 53  Rhythm: Sinus bradycardia   QRS Axis: normal  Intervals: Intraventricular conduction delay  ST/T Wave abnormalities: T-wave inversions in inferior leads  Conduction Disutrbances: none  Narrative Interpretation: Sinus bradycardia, intraventricular conduction delay, T-wave inversion in inferior leads which is  all new when compared to prior EKG in 2006; artifact present     ED PROGRESS:  2:29 PM  Pt with bradycardia and syncopal event.   No sz activity.  Now back to baseline with HR in 50s and SBP in 100s.  Repeat glucose is 153.  Repeat EKG shows a possible third degree heart block that was not as apparent on his initial EKG. Chest x-ray shows vascular congestion. Patient may be having cardiogenic shock  Rather than septic. His labs and cultures are pending. Patient is oriented x3 with no complaints currently. He denies any chest pain or shortness of breath. No history of cardiac disease. Pacer pads are currently on patient. We are monitoring him closely. He has been seen by [facility] cardiologist in the past. [facility] consult them for third-degree heart block.   Date: 09/30/2012 14:30  Rate: 54  Rhythm: Sinus bradycardia  QRS Axis: normal  Intervals: Intraventricular conduction delay, possible third degree heart block  ST/T Wave abnormalities: normal  Conduction Disutrbances: none  Narrative Interpretation:  Possible third degree heart block, intraventricular conduction delay, new compared to prior EKGs in 2006 at 2003    3:05 PM  Spoke with Dr. Mayford Knife with [facility] cardiology. Patient reports he's had a stress test at [facility] cardiology over 5 years ago. He denies any other cardiac hx and no hx of recent CP, SOB.  Pt is not on any medications that may cause AV block, bradycardia.  Dr Mayford Knife will have the physician assistant from [facility] come see the patient. He is having bradycardia in the upper 30s. Systolic blood pressure in the 80s.Initially had improved to 100s-110s after IVF. He has a pacer pads on currently. Will give atropine 0.5 mg IV. He is still slightly hypotensive. We are continuing IV fluids. He is on his second liter of IV fluids. He has no hypoxia. He is a non-rebreather in place after his syncopal event. Lungs are clear to auscultation but chest x-ray does show some mild vascular congestion. Cardiac labs pending. Will transfer over to the trauma bay for closer monitoring. Transferred to Dr. Hedwig Morton.  Patient will need  admission for pacemaker placement for third-degree heart block.  CRITICAL CARE Performed by: Raelyn Number   Total critical care time:60 minutes  Critical care time was exclusive of separately billable procedures and treating other patients.  Critical care was necessary to treat or prevent imminent or life-threatening deterioration.  Critical care was time spent personally by me on the following activities: development of treatment plan with patient and/or surrogate as well as nursing, discussions with consultants, evaluation of patient's response to treatment, examination of patient, obtaining history from patient or surrogate, ordering and performing treatments and interventions, ordering and review of laboratory studies, ordering and review of radiographic studies, pulse oximetry and re-evaluation of patient's condition. ca  Layla Maw Alois Colgan, DO 09/30/12 1548  Layla Maw Giavanni Zeitlin, DO 09/30/12 2337

## 2012-09-30 NOTE — ED Notes (Signed)
Patient to Cath Lab.

## 2012-09-30 NOTE — H&P (Signed)
Admit date: 09/30/2012 Referring Physician Dr. Beverely Pace Primary Cardiologist NONW Chief complaint/reason for admission: syncope with complete heart block2  HPI: This is a 62yo WM with a history of HTN, dyslipidemia, prediabetes who was in his USOH until this past Saturday when he developed cold symptoms consisting of sneezing, body aches, subjective fever and chills and cough.  He was seen in an Urgent Care on Sunday and was told he had a cold.  He continued to feel poorly and developed significant weakness.  His wife states that he would all of a sudden become diaphoretic and unresponsive for a few seconds and then come to.  They wnet back to his primary MD, Dr. Azucena Cecil, this am and had a syncopal episodes.  EMS was called and he was found to be in complete heart block with a ventricular escape rhythm of 25-30bpm .  On arrival in the ER he was hypotensive with a SBP of 75-16mmHg, cyanotic appearing but denied chest pain, SOB, nausea, vomiting.  He was afebrile in the ER.  He was started on IV dopamine for pressure support and blood cultures were obtained before antibiotics were started.  He went emergently for temporary pacemaker placement by Dr. Sanjuana Kava.  He states that he has never had chest pain or exertional dyspnea and was well until he developed with cold symptoms of Saturday.    PMH:    Past Medical History  Diagnosis Date  . Hypertension   . Hyperlipidemia   . Prediabetes   . Cancer     prostate CA s/p seed implant  . Dyslipidemia   . Vitamin D deficiency   . Allergic rhinitis   . Arthritis     OA of knee  . Colon polyps   . Diverticulosis     PSH:    Past Surgical History  Procedure Laterality Date  . Knee arthroscopy      ALLERGIES:   Simvastatin  Prior to Admit Meds:   Prescriptions prior to admission  Medication Sig Dispense Refill  . atorvastatin (LIPITOR) 40 MG tablet Take 40 mg by mouth daily.      Marland Kitchen lisinopril-hydrochlorothiazide (PRINZIDE,ZESTORETIC) 20-25 MG per  tablet Take 1 tablet by mouth daily.      . naproxen sodium (ANAPROX) 220 MG tablet Take 220 mg by mouth 2 (two) times daily with a meal.       Family HX:   History reviewed. No pertinent family history. Social HX:    History   Social History  . Marital Status: Married    Spouse Name: N/A    Number of Children: N/A  . Years of Education: N/A   Occupational History  . Not on file.   Social History Main Topics  . Smoking status: Former Smoker -- 0.50 packs/day for 25 years    Types: Cigarettes    Quit date: 09/09/2012  . Smokeless tobacco: Never Used  . Alcohol Use: No  . Drug Use: No  . Sexual Activity: Yes   Other Topics Concern  . Not on file   Social History Narrative  . No narrative on file     ROS:  All 11 ROS were addressed and are negative except what is stated in the HPI  PHYSICAL EXAM Filed Vitals:   09/30/12 2000  BP: 93/72  Pulse: 70  Temp: 97.5 F (36.4 C)  Resp: 25   General: Well developed, well nourished, in moderate distress appearing cyanotic Head: Eyes PERRLA, No xanthomas.   Normal cephalic and atramatic  Lungs:   Clear bilaterally to auscultation and percussion. Heart:   HRRR S1 S2 Pulses are 2+ & equal.            No carotid bruit. No JVD.  No abdominal bruits. No femoral bruits. Abdomen: Bowel sounds are positive, abdomen soft and non-tender without masses Extremities:   No clubbingor edema.  DP +1 Neuro: Alert and oriented X 3. Psych:  Good affect, responds appropriately   Labs:   Lab Results  Component Value Date   WBC 9.0 09/30/2012   HGB 12.0* 09/30/2012   HCT 34.4* 09/30/2012   MCV 87.3 09/30/2012   PLT 158 09/30/2012    Recent Labs Lab 09/30/12 1449  NA 128*  K 3.2*  CL 88*  CO2 15*  BUN 44*  CREATININE 2.02*  CALCIUM 8.3*  PROT 6.3  BILITOT 0.4  ALKPHOS 40  ALT 82*  AST 274*  GLUCOSE 145*   Lab Results  Component Value Date   TROPONINI >20.00* 09/30/2012   No results found for this basename: PTT   Lab Results   Component Value Date   INR 1.13 09/30/2012         Radiology: *RADIOLOGY REPORT*  Clinical Data: Hypertension, fall  PORTABLE CHEST - 1 VIEW  Comparison: 09/11/2004  Findings: Borderline cardiomegaly. Central mild vascular  congestion without convincing pulmonary edema. No acute infiltrate  or pleural effusion.  IMPRESSION:  Borderline cardiomegaly. Central mild vascular congestion without  convincing pulmonary edema. No active disease.    EKG:  NSR with complete heart block and ventricular escape rhythm  ASSESSMENT:  1.  Acute hypotension with shock? Primary cardiogenic from complete heart block vs. Combination of septic shock and cardiogenic. Still requiring pressor support to maintain a SBP of despite temp pacing at 60bpm.  This raises the question of possible sepsis as well.  His echo shows mild LV dysfunction EF 45% with small RV raising question of low preload from dehydration from viral syndrome.  His BNP is elevated but this may be due to initial marked bradycardia.  His sodium and chloride are low and he is hypokalemic all pointing to dehydration.  He has some mild renal insuff which is most likley a result  of cardiogenic shock with a component of dehydration as well. 2.  Elevated troponin in the setting of viral syndrome and globally reduced LVF and small pericardial effusion, clinically  most consistent with viral myopericarditis.  He has no symptoms to suggest acute coronary syndrome.   3.  Mild LV dysfunction most likely due to acute myocarditis 4.  Elevated WBC secondary to stress reaction vs. Viral syndrome - blood cultures pending and he has received IV antibiotics - lactic acid increased but this is probably from profound hypotension and has improved after stabilizing BP and temp pacer 5.  Hypokalemia 6.  Acute kidney injury secondary to #1 7.  Transaminitis secondary to shock 8.  Complete heart block s/p temp pacer - heart block could be explained by myocarditis  which should resolve with resolution of myocarditis 9.  Syncope secondary to #1 and #8 10.  Left shoulder pain from fall  PLAN:   1.  Admit to CCU 2.  SQ Heparin for DVT prophylaxis.  At this time I do not think he has an acute coronary syndrome and therefore will not fully anticoagulate especially in the setting of possible myopericarditis where there would be risk in fully anticoagulating with possiblity of converting pericardial effusion to hemorrhagic effusion.  SInce he  is bedridden at present he is at risk for DVT but will avoid SCDs since he has a temp pacer in right groin 3.  Cycle cardiac enzymes 4.  Xray left shoulder 5.  IVF hydration 6.  Follow blood cultures 7. IV dopamine to support BP 8.  CM consult obtained 9.  Replete potassium 10.  Follow heart rhythm to see if heart block resolves with resolution of myocarditis 4.    Quintella Reichert, MD  09/30/2012  10:33 PM

## 2012-10-01 ENCOUNTER — Inpatient Hospital Stay (HOSPITAL_COMMUNITY): Payer: 59 | Admitting: Anesthesiology

## 2012-10-01 ENCOUNTER — Encounter (HOSPITAL_COMMUNITY)
Admission: EM | Disposition: A | Discharge status: Other Acute Inpatient Hospital | Payer: Self-pay | Source: Home / Self Care | Attending: Pulmonary Disease

## 2012-10-01 ENCOUNTER — Encounter (HOSPITAL_COMMUNITY): Payer: Self-pay | Admitting: Anesthesiology

## 2012-10-01 ENCOUNTER — Inpatient Hospital Stay (HOSPITAL_COMMUNITY): Payer: 59

## 2012-10-01 ENCOUNTER — Encounter (HOSPITAL_COMMUNITY): Payer: 59 | Admitting: Anesthesiology

## 2012-10-01 DIAGNOSIS — I472 Ventricular tachycardia, unspecified: Secondary | ICD-10-CM

## 2012-10-01 DIAGNOSIS — J9600 Acute respiratory failure, unspecified whether with hypoxia or hypercapnia: Secondary | ICD-10-CM

## 2012-10-01 DIAGNOSIS — I469 Cardiac arrest, cause unspecified: Secondary | ICD-10-CM

## 2012-10-01 DIAGNOSIS — R570 Cardiogenic shock: Secondary | ICD-10-CM

## 2012-10-01 DIAGNOSIS — I2510 Atherosclerotic heart disease of native coronary artery without angina pectoris: Secondary | ICD-10-CM

## 2012-10-01 DIAGNOSIS — I409 Acute myocarditis, unspecified: Secondary | ICD-10-CM

## 2012-10-01 DIAGNOSIS — I214 Non-ST elevation (NSTEMI) myocardial infarction: Secondary | ICD-10-CM

## 2012-10-01 DIAGNOSIS — I442 Atrioventricular block, complete: Principal | ICD-10-CM

## 2012-10-01 DIAGNOSIS — N179 Acute kidney failure, unspecified: Secondary | ICD-10-CM

## 2012-10-01 DIAGNOSIS — J9601 Acute respiratory failure with hypoxia: Secondary | ICD-10-CM

## 2012-10-01 HISTORY — PX: INTRA-AORTIC BALLOON PUMP INSERTION: SHX5475

## 2012-10-01 HISTORY — PX: CANNULATION FOR CARDIOPULMONARY BYPASS: SHX6411

## 2012-10-01 HISTORY — PX: LEFT HEART CATHETERIZATION WITH CORONARY ANGIOGRAM: SHX5451

## 2012-10-01 HISTORY — PX: INTRAOPERATIVE TRANSESOPHAGEAL ECHOCARDIOGRAM: SHX5062

## 2012-10-01 LAB — POCT I-STAT 3, ART BLOOD GAS (G3+)
Acid-base deficit: 15 mmol/L — ABNORMAL HIGH (ref 0.0–2.0)
Acid-base deficit: 2 mmol/L (ref 0.0–2.0)
Acid-base deficit: 1 mmol/L (ref 0.0–2.0)
Bicarbonate: 16.5 mEq/L — ABNORMAL LOW (ref 20.0–24.0)
Bicarbonate: 19.7 mEq/L — ABNORMAL LOW (ref 20.0–24.0)
O2 Saturation: 100 %
O2 Saturation: 100 %
TCO2: 18 mmol/L (ref 0–100)
TCO2: 21 mmol/L (ref 0–100)
TCO2: 23 mmol/L (ref 0–100)
TCO2: 25 mmol/L (ref 0–100)
pCO2 arterial: 25.6 mmHg — ABNORMAL LOW (ref 35.0–45.0)
pCO2 arterial: 33.7 mmHg — ABNORMAL LOW (ref 35.0–45.0)
pCO2 arterial: 34.3 mmHg — ABNORMAL LOW (ref 35.0–45.0)
pCO2 arterial: 43.1 mmHg (ref 35.0–45.0)
pCO2 arterial: 43 mmHg (ref 35.0–45.0)
pH, Arterial: 7.422 (ref 7.350–7.450)
pH, Arterial: 7.252 — ABNORMAL LOW (ref 7.350–7.450)
pH, Arterial: 7.291 — ABNORMAL LOW (ref 7.350–7.450)
pH, Arterial: 7.316 — ABNORMAL LOW (ref 7.350–7.450)
pH, Arterial: 7.358 (ref 7.350–7.450)
pH, Arterial: 7.347 — ABNORMAL LOW (ref 7.350–7.450)
pO2, Arterial: 74 mmHg — ABNORMAL LOW (ref 80.0–100.0)
pO2, Arterial: 79 mmHg — ABNORMAL LOW (ref 80.0–100.0)
pO2, Arterial: 228 mmHg — ABNORMAL HIGH (ref 80.0–100.0)
pO2, Arterial: 465 mmHg — ABNORMAL HIGH (ref 80.0–100.0)
pO2, Arterial: 176 mmHg — ABNORMAL HIGH (ref 80.0–100.0)

## 2012-10-01 LAB — POCT I-STAT 4, (NA,K, GLUC, HGB,HCT)
Glucose, Bld: 181 mg/dL — ABNORMAL HIGH (ref 70–99)
Glucose, Bld: 174 mg/dL — ABNORMAL HIGH (ref 70–99)
Glucose, Bld: 177 mg/dL — ABNORMAL HIGH (ref 70–99)
Glucose, Bld: 177 mg/dL — ABNORMAL HIGH (ref 70–99)
HCT: 10 % — ABNORMAL LOW (ref 39.0–52.0)
HCT: 29 % — ABNORMAL LOW (ref 39.0–52.0)
HCT: 24 % — ABNORMAL LOW (ref 39.0–52.0)
Hemoglobin: 3.4 g/dL — CL (ref 13.0–17.0)
Hemoglobin: 8.5 g/dL — ABNORMAL LOW (ref 13.0–17.0)
Hemoglobin: 8.2 g/dL — ABNORMAL LOW (ref 13.0–17.0)
Potassium: 3.3 mEq/L — ABNORMAL LOW (ref 3.5–5.1)
Potassium: 3.1 mEq/L — ABNORMAL LOW (ref 3.5–5.1)
Sodium: 134 mEq/L — ABNORMAL LOW (ref 135–145)
Sodium: 135 mEq/L (ref 135–145)

## 2012-10-01 LAB — POCT I-STAT 3, VENOUS BLOOD GAS (G3P V)
Acid-base deficit: 8 mmol/L — ABNORMAL HIGH (ref 0.0–2.0)
Bicarbonate: 18.6 mEq/L — ABNORMAL LOW (ref 20.0–24.0)
O2 Saturation: 54 %
pH, Ven: 7.268 (ref 7.250–7.300)
pO2, Ven: 33 mmHg (ref 30.0–45.0)

## 2012-10-01 LAB — PREPARE PLATELET PHERESIS: Unit division: 0

## 2012-10-01 LAB — CBC WITH DIFFERENTIAL/PLATELET
Lymphocytes Relative: 12 % (ref 12–46)
Lymphs Abs: 1.5 10*3/uL (ref 0.7–4.0)
Neutro Abs: 9.7 10*3/uL — ABNORMAL HIGH (ref 1.7–7.7)
Neutrophils Relative %: 78 % — ABNORMAL HIGH (ref 43–77)
Platelets: 201 10*3/uL (ref 150–400)
RBC: 4.01 MIL/uL — ABNORMAL LOW (ref 4.22–5.81)
WBC: 12.3 10*3/uL — ABNORMAL HIGH (ref 4.0–10.5)

## 2012-10-01 LAB — CK TOTAL AND CKMB (NOT AT ARMC)
CK, MB: 111.1 ng/mL (ref 0.3–4.0)
Total CK: 2769 U/L — ABNORMAL HIGH (ref 7–232)

## 2012-10-01 LAB — TROPONIN I
Troponin I: >20 ng/mL (ref <0.30)
Troponin I: >20 ng/mL (ref <0.30)

## 2012-10-01 LAB — COMPREHENSIVE METABOLIC PANEL
ALT: 362 U/L — ABNORMAL HIGH (ref 0–53)
Alkaline Phosphatase: 40 U/L (ref 39–117)
CO2: 19 mEq/L (ref 19–32)
Chloride: 91 mEq/L — ABNORMAL LOW (ref 96–112)
GFR calc Af Amer: 73 mL/min — ABNORMAL LOW (ref >90)
GFR calc non Af Amer: 63 mL/min — ABNORMAL LOW (ref >90)
Glucose, Bld: 141 mg/dL — ABNORMAL HIGH (ref 70–99)
Potassium: 4 mEq/L (ref 3.5–5.1)
Sodium: 127 mEq/L — ABNORMAL LOW (ref 135–145)
Total Protein: 5.8 g/dL — ABNORMAL LOW (ref 6.0–8.3)

## 2012-10-01 LAB — URINE CULTURE

## 2012-10-01 LAB — HEPATIC FUNCTION PANEL
Albumin: 3 g/dL — ABNORMAL LOW (ref 3.5–5.2)
Total Protein: 6 g/dL (ref 6.0–8.3)

## 2012-10-01 LAB — PREPARE FRESH FROZEN PLASMA

## 2012-10-01 LAB — TYPE AND SCREEN: Antibody Screen: NEGATIVE

## 2012-10-01 LAB — CARBOXYHEMOGLOBIN
Methemoglobin: 1.3 % (ref 0.0–1.5)
O2 Saturation: 77.7 %
Total hemoglobin: 14.1 g/dL (ref 13.5–18.0)

## 2012-10-01 SURGERY — LEFT HEART CATHETERIZATION WITH CORONARY ANGIOGRAM
Anesthesia: LOCAL

## 2012-10-01 SURGERY — INTRA-AORTIC BALLOON PUMP INSERTION
Anesthesia: Moderate Sedation | Laterality: Bilateral

## 2012-10-01 SURGERY — CANNULATION FOR CARDIOPULMONARY BYPASS
Anesthesia: General | Site: Mouth | Wound class: Clean

## 2012-10-01 MED ORDER — FUROSEMIDE 10 MG/ML IJ SOLN
20.0000 mg | Freq: Once | INTRAMUSCULAR | Status: AC
  Administered 2012-10-01: 20 mg via INTRAVENOUS

## 2012-10-01 MED ORDER — DEXMEDETOMIDINE HCL IN NACL 400 MCG/100ML IV SOLN
0.1000 ug/kg/h | INTRAVENOUS | Status: DC
  Filled 2012-10-01: qty 100

## 2012-10-01 MED ORDER — SODIUM CHLORIDE 0.9 % IV SOLN: INTRAVENOUS | Status: DC

## 2012-10-01 MED ORDER — SODIUM CHLORIDE 0.9 % IV SOLN
INTRAVENOUS | Status: DC
  Filled 2012-10-01: qty 1

## 2012-10-01 MED ORDER — POTASSIUM CHLORIDE 2 MEQ/ML IV SOLN: 80.0000 meq | INTRAVENOUS | Status: DC

## 2012-10-01 MED ORDER — DOPAMINE-DEXTROSE 3.2-5 MG/ML-% IV SOLN: 2.0000 ug/kg/min | INTRAVENOUS | Status: DC

## 2012-10-01 MED ORDER — LACTATED RINGERS IV SOLN
INTRAVENOUS | Status: DC | PRN
  Administered 2012-10-01: 10:00:00 via INTRAVENOUS

## 2012-10-01 MED ORDER — DEXTROSE 5 % IV SOLN
1.5000 g | INTRAVENOUS | Status: AC
  Administered 2012-10-01: 1.5 g via INTRAVENOUS
  Filled 2012-10-01: qty 1.5

## 2012-10-01 MED ORDER — HEPARIN BOLUS VIA INFUSION
3000.0000 [IU] | Freq: Once | INTRAVENOUS | Status: AC
  Administered 2012-10-01: 3000 [IU] via INTRAVENOUS
  Filled 2012-10-01: qty 3000

## 2012-10-01 MED ORDER — LORAZEPAM 2 MG/ML IJ SOLN
1.0000 mg | Freq: Once | INTRAMUSCULAR | Status: AC
  Administered 2012-10-01: 1 mg via INTRAVENOUS

## 2012-10-01 MED ORDER — NOREPINEPHRINE BITARTRATE 1 MG/ML IJ SOLN
2.0000 ug/min | INTRAVENOUS | Status: DC
  Filled 2012-10-01: qty 4

## 2012-10-01 MED ORDER — AMIODARONE HCL IN DEXTROSE 360-4.14 MG/200ML-% IV SOLN: 60.0000 mg/h | INTRAVENOUS | Status: DC

## 2012-10-01 MED ORDER — SODIUM CHLORIDE 0.9 % IV SOLN
1.0000 mg/h | INTRAVENOUS | Status: DC
  Filled 2012-10-01: qty 10

## 2012-10-01 MED ORDER — MIDAZOLAM BOLUS VIA INFUSION: 1.0000 mg | INTRAVENOUS | Status: AC | PRN

## 2012-10-01 MED ORDER — LIDOCAINE HCL (PF) 1 % IJ SOLN
INTRAMUSCULAR | Status: AC
  Filled 2012-10-01: qty 30

## 2012-10-01 MED ORDER — INSULIN REGULAR HUMAN 100 UNIT/ML IJ SOLN
100.0000 [IU] | INTRAMUSCULAR | Status: DC | PRN
  Administered 2012-10-01: 3.6 [IU]/h via INTRAVENOUS

## 2012-10-01 MED ORDER — SODIUM CHLORIDE 0.9 % IV SOLN
INTRAVENOUS | Status: DC
  Filled 2012-10-01: qty 30

## 2012-10-01 MED ORDER — INSULIN ASPART 100 UNIT/ML ~~LOC~~ SOLN: 0.0000 [IU] | SUBCUTANEOUS | Status: DC

## 2012-10-01 MED ORDER — VERAPAMIL HCL 2.5 MG/ML IV SOLN
INTRAVENOUS | Status: AC
  Filled 2012-10-01: qty 2

## 2012-10-01 MED ORDER — MILRINONE IN DEXTROSE 20 MG/100ML IV SOLN
0.1250 ug/kg/min | INTRAVENOUS | Status: DC
  Filled 2012-10-01: qty 100

## 2012-10-01 MED ORDER — PANTOPRAZOLE SODIUM 40 MG IV SOLR: 40.0000 mg | INTRAVENOUS | Status: DC

## 2012-10-01 MED ORDER — NOREPINEPHRINE BITARTRATE 1 MG/ML IJ SOLN: 2.0000 ug/min | INTRAVENOUS | Status: AC

## 2012-10-01 MED ORDER — ASPIRIN 81 MG PO TBEC: 81.0000 mg | DELAYED_RELEASE_TABLET | Freq: Every day | ORAL | Status: AC

## 2012-10-01 MED ORDER — MILRINONE IN DEXTROSE 20 MG/100ML IV SOLN: 0.1250 ug/kg/min | INTRAVENOUS | Status: AC

## 2012-10-01 MED ORDER — AMIODARONE HCL IN DEXTROSE 360-4.14 MG/200ML-% IV SOLN
30.0000 mg/h | INTRAVENOUS | Status: DC
  Filled 2012-10-01: qty 200

## 2012-10-01 MED ORDER — ROCURONIUM BROMIDE 100 MG/10ML IV SOLN
INTRAVENOUS | Status: DC | PRN
  Administered 2012-10-01 (×2): 100 mg via INTRAVENOUS

## 2012-10-01 MED ORDER — EPINEPHRINE HCL 1 MG/ML IJ SOLN
0.5000 ug/min | INTRAVENOUS | Status: AC
  Administered 2012-10-01: 5 ug/min via INTRAVENOUS
  Filled 2012-10-01: qty 4

## 2012-10-01 MED ORDER — AMIODARONE HCL IN DEXTROSE 360-4.14 MG/200ML-% IV SOLN: 60.0000 mg/h | INTRAVENOUS | Status: AC

## 2012-10-01 MED ORDER — MIDAZOLAM HCL 5 MG/5ML IJ SOLN
INTRAMUSCULAR | Status: DC | PRN
  Administered 2012-10-01 (×2): 5 mg via INTRAVENOUS

## 2012-10-01 MED ORDER — FUROSEMIDE 10 MG/ML IJ SOLN: 40.0000 mg | Freq: Two times a day (BID) | INTRAMUSCULAR | Status: AC

## 2012-10-01 MED ORDER — EPINEPHRINE HCL 1 MG/ML IJ SOLN
0.5000 ug/min | INTRAVENOUS | Status: DC
  Filled 2012-10-01: qty 4

## 2012-10-01 MED ORDER — NOREPINEPHRINE BITARTRATE 1 MG/ML IJ SOLN
4000.0000 ug | INTRAMUSCULAR | Status: DC | PRN
  Administered 2012-10-01: 4 ug/kg/min via INTRAVENOUS

## 2012-10-01 MED ORDER — SODIUM CHLORIDE 0.9 % IV SOLN
200.0000 ug | INTRAVENOUS | Status: DC | PRN
  Administered 2012-10-01: .5 ug/kg/h via INTRAVENOUS
  Administered 2012-10-01: 0.3 ug/kg/h via INTRAVENOUS

## 2012-10-01 MED ORDER — FUROSEMIDE 10 MG/ML IJ SOLN: 20.0000 mg | Freq: Once | INTRAMUSCULAR | Status: DC

## 2012-10-01 MED ORDER — HEPARIN (PORCINE) IN NACL 100-0.45 UNIT/ML-% IJ SOLN
1200.0000 [IU]/h | INTRAMUSCULAR | Status: DC
  Administered 2012-10-01: 1200 [IU]/h via INTRAVENOUS
  Filled 2012-10-01: qty 250

## 2012-10-01 MED ORDER — SODIUM CHLORIDE 0.9 % IV SOLN
INTRAVENOUS | Status: DC
  Filled 2012-10-01: qty 40

## 2012-10-01 MED ORDER — PHENYLEPHRINE HCL 10 MG/ML IJ SOLN
30.0000 ug/min | INTRAVENOUS | Status: DC
  Filled 2012-10-01: qty 2

## 2012-10-01 MED ORDER — FENTANYL CITRATE 0.05 MG/ML IJ SOLN
INTRAMUSCULAR | Status: DC | PRN
  Administered 2012-10-01: 250 ug via INTRAVENOUS

## 2012-10-01 MED ORDER — HEPARIN SODIUM (PORCINE) 5000 UNIT/ML IJ SOLN
5000.0000 [IU] | Freq: Three times a day (TID) | INTRAMUSCULAR | Status: DC
  Filled 2012-10-01 (×4): qty 1

## 2012-10-01 MED ORDER — DOBUTAMINE IN D5W 4-5 MG/ML-% IV SOLN
2.5000 ug/kg/min | INTRAVENOUS | Status: DC
  Filled 2012-10-01: qty 250

## 2012-10-01 MED ORDER — HALOPERIDOL LACTATE 5 MG/ML IJ SOLN
INTRAMUSCULAR | Status: AC
  Filled 2012-10-01: qty 1

## 2012-10-01 MED ORDER — FUROSEMIDE 10 MG/ML IJ SOLN
INTRAMUSCULAR | Status: AC
  Filled 2012-10-01: qty 8

## 2012-10-01 MED ORDER — FUROSEMIDE 10 MG/ML IJ SOLN
40.0000 mg | Freq: Two times a day (BID) | INTRAMUSCULAR | Status: DC
Start: 2012-10-01 — End: 2012-10-01
  Administered 2012-10-01: 40 mg via INTRAVENOUS

## 2012-10-01 MED ORDER — FUROSEMIDE 10 MG/ML IJ SOLN: 80.0000 mg | Freq: Once | INTRAMUSCULAR | Status: DC

## 2012-10-01 MED ORDER — AMIODARONE IV BOLUS ONLY 150 MG/100ML
150.0000 mg | Freq: Once | INTRAVENOUS | Status: DC
Start: 2012-10-01 — End: 2012-10-01

## 2012-10-01 MED ORDER — ETOMIDATE 2 MG/ML IV SOLN
20.0000 mg | Freq: Once | INTRAVENOUS | Status: AC
  Administered 2012-10-01: 20 mg via INTRAVENOUS

## 2012-10-01 MED ORDER — HEPARIN SODIUM (PORCINE) 1000 UNIT/ML IJ SOLN
INTRAMUSCULAR | Status: AC
  Filled 2012-10-01: qty 1

## 2012-10-01 MED ORDER — FENTANYL BOLUS VIA INFUSION
25.0000 ug | Freq: Four times a day (QID) | INTRAVENOUS | Status: DC | PRN
  Filled 2012-10-01: qty 100

## 2012-10-01 MED ORDER — CHLORHEXIDINE GLUCONATE 0.12 % MT SOLN: 15.0000 mL | Freq: Two times a day (BID) | OROMUCOSAL | Status: DC

## 2012-10-01 MED ORDER — ALBUMIN HUMAN 5 % IV SOLN
INTRAVENOUS | Status: DC | PRN
  Administered 2012-10-01: 11:00:00 via INTRAVENOUS

## 2012-10-01 MED ORDER — SODIUM CHLORIDE 0.9 % IV SOLN: INTRAVENOUS | Status: AC

## 2012-10-01 MED ORDER — PLASMA-LYTE 148 IV SOLN
INTRAVENOUS | Status: AC
  Administered 2012-10-01: 10:00:00
  Filled 2012-10-01: qty 2.5

## 2012-10-01 MED ORDER — DOPAMINE-DEXTROSE 3.2-5 MG/ML-% IV SOLN
2.0000 ug/kg/min | INTRAVENOUS | Status: DC
  Filled 2012-10-01: qty 250

## 2012-10-01 MED ORDER — 0.9 % SODIUM CHLORIDE (POUR BTL) OPTIME
TOPICAL | Status: DC | PRN
  Administered 2012-10-01: 5000 mL

## 2012-10-01 MED ORDER — NOREPINEPHRINE BITARTRATE 1 MG/ML IJ SOLN
2.0000 ug/min | INTRAVENOUS | Status: DC
  Filled 2012-10-01: qty 8

## 2012-10-01 MED ORDER — NITROGLYCERIN 0.2 MG/ML ON CALL CATH LAB
INTRAVENOUS | Status: AC
  Filled 2012-10-01: qty 1

## 2012-10-01 MED ORDER — LORAZEPAM 2 MG/ML IJ SOLN
INTRAMUSCULAR | Status: AC
  Administered 2012-10-01: 1 mg via INTRAVENOUS
  Filled 2012-10-01: qty 1

## 2012-10-01 MED ORDER — SODIUM BICARBONATE 8.4 % IV SOLN
INTRAVENOUS | Status: DC | PRN
  Administered 2012-10-01: 50 meq via INTRAVENOUS
  Administered 2012-10-01: 100 meq via INTRAVENOUS
  Administered 2012-10-01: 50 meq via INTRAVENOUS

## 2012-10-01 MED ORDER — MAGNESIUM SULFATE 50 % IJ SOLN
40.0000 meq | INTRAMUSCULAR | Status: DC
  Filled 2012-10-01: qty 10

## 2012-10-01 MED ORDER — BIOTENE DRY MOUTH MT LIQD: 1.0000 "application " | Freq: Four times a day (QID) | OROMUCOSAL | Status: DC

## 2012-10-01 MED ORDER — MIDAZOLAM BOLUS VIA INFUSION
1.0000 mg | INTRAVENOUS | Status: DC | PRN
  Filled 2012-10-01: qty 2

## 2012-10-01 MED ORDER — DEXTROSE 5 % IV SOLN
750.0000 mg | INTRAVENOUS | Status: DC
  Filled 2012-10-01: qty 750

## 2012-10-01 MED ORDER — DOPAMINE-DEXTROSE 3.2-5 MG/ML-% IV SOLN
INTRAVENOUS | Status: DC | PRN
  Administered 2012-10-01: 3 ug/kg/min via INTRAVENOUS

## 2012-10-01 MED ORDER — HEPARIN (PORCINE) IN NACL 2-0.9 UNIT/ML-% IJ SOLN
INTRAMUSCULAR | Status: AC
  Filled 2012-10-01: qty 1000

## 2012-10-01 MED ORDER — VANCOMYCIN HCL 10 G IV SOLR
1250.0000 mg | INTRAVENOUS | Status: AC
  Administered 2012-10-01: 1250 mg via INTRAVENOUS
  Filled 2012-10-01: qty 1250

## 2012-10-01 MED ORDER — EPINEPHRINE HCL 1 MG/ML IJ SOLN
INTRAMUSCULAR | Status: DC | PRN
  Administered 2012-10-01: .3 mg via INTRAVENOUS

## 2012-10-01 MED ORDER — PANTOPRAZOLE SODIUM 40 MG IV SOLR: 40.0000 mg | INTRAVENOUS | Status: AC

## 2012-10-01 MED ORDER — AMIODARONE HCL IN DEXTROSE 360-4.14 MG/200ML-% IV SOLN
INTRAVENOUS | Status: AC
  Filled 2012-10-01: qty 200

## 2012-10-01 MED ORDER — SODIUM CHLORIDE 0.9 % IV SOLN
25.0000 ug/h | INTRAVENOUS | Status: DC
  Administered 2012-10-01: 50 ug/h via INTRAVENOUS
  Filled 2012-10-01 (×2): qty 50

## 2012-10-01 MED ORDER — ROCURONIUM BROMIDE 50 MG/5ML IV SOLN
50.0000 mg | Freq: Once | INTRAVENOUS | Status: AC
  Administered 2012-10-01: 50 mg via INTRAVENOUS

## 2012-10-01 MED ORDER — SODIUM CHLORIDE 0.9 % IV SOLN
1.0000 mg/h | INTRAVENOUS | Status: DC
  Administered 2012-10-01: 4 mg/h via INTRAVENOUS
  Filled 2012-10-01 (×2): qty 10

## 2012-10-01 MED ORDER — POTASSIUM CHLORIDE 2 MEQ/ML IV SOLN
80.0000 meq | INTRAVENOUS | Status: DC
  Filled 2012-10-01: qty 40

## 2012-10-01 MED ORDER — HEPARIN SODIUM (PORCINE) 1000 UNIT/ML IJ SOLN
INTRAMUSCULAR | Status: DC | PRN
  Administered 2012-10-01: 43 mL via INTRAVENOUS

## 2012-10-01 MED ORDER — NITROGLYCERIN IN D5W 200-5 MCG/ML-% IV SOLN
2.0000 ug/min | INTRAVENOUS | Status: DC
  Filled 2012-10-01: qty 250

## 2012-10-01 MED ORDER — SODIUM CHLORIDE 0.9 % IV SOLN: 10.0000 ug/h | INTRAVENOUS | Status: AC

## 2012-10-01 MED ORDER — FENTANYL BOLUS VIA INFUSION: 25.0000 ug | Freq: Four times a day (QID) | INTRAVENOUS | Status: AC | PRN

## 2012-10-01 MED ORDER — LORAZEPAM 2 MG/ML IJ SOLN
INTRAMUSCULAR | Status: AC
  Filled 2012-10-01: qty 1

## 2012-10-01 MED ORDER — SODIUM CHLORIDE 0.9 % IV SOLN: 1.0000 mg/h | INTRAVENOUS | Status: AC

## 2012-10-01 MED ORDER — SODIUM CHLORIDE 0.9 % IV SOLN
25.0000 ug/h | INTRAVENOUS | Status: DC
  Filled 2012-10-01: qty 50

## 2012-10-01 SURGICAL SUPPLY — 83 items
ADAPTER CARDIO PERF ANTE/RETRO (ADAPTER) ×4 IMPLANT
ATTRACTOMAT 16X20 MAGNETIC DRP (DRAPES) ×4 IMPLANT
BAG DECANTER FOR FLEXI CONT (MISCELLANEOUS) ×4 IMPLANT
BASKET HEART  (ORDER IN 25'S) (MISCELLANEOUS) ×1
BASKET HEART (ORDER IN 25'S) (MISCELLANEOUS) ×1
BASKET HEART (ORDER IN 25S) (MISCELLANEOUS) ×2 IMPLANT
BLADE STERNUM SYSTEM 6 (BLADE) ×4 IMPLANT
BLADE SURG 10 STRL SS (BLADE) ×4 IMPLANT
BLADE SURG 12 STRL SS (BLADE) ×4 IMPLANT
BLADE SURG 15 STRL LF DISP TIS (BLADE) ×2 IMPLANT
BLADE SURG 15 STRL SS (BLADE) ×2
BLADE SURG ROTATE 9660 (MISCELLANEOUS) ×8 IMPLANT
CANISTER SUCTION 2500CC (MISCELLANEOUS) ×4 IMPLANT
CANNULA AORTIC HI-FLOW 6.5M20F (CANNULA) ×4 IMPLANT
CANNULA GUNDRY RCSP 15FR (MISCELLANEOUS) ×4 IMPLANT
CANNULA OPTISITE PERFUSION 18F (CANNULA) ×4 IMPLANT
CATH CPB KIT VANTRIGT (MISCELLANEOUS) ×4 IMPLANT
CATH ROBINSON RED A/P 18FR (CATHETERS) ×4 IMPLANT
CATH THORACIC 28FR (CATHETERS) ×4 IMPLANT
CATH THORACIC 28FR RT ANG (CATHETERS) ×4 IMPLANT
CATH THORACIC 36FR (CATHETERS) ×4 IMPLANT
CATH THORACIC 36FR RT ANG (CATHETERS) ×4 IMPLANT
CLOTH BEACON ORANGE TIMEOUT ST (SAFETY) ×4 IMPLANT
COVER PROBE W GEL 5X96 (DRAPES) ×4 IMPLANT
COVER SURGICAL LIGHT HANDLE (MISCELLANEOUS) ×4 IMPLANT
CRADLE DONUT ADULT HEAD (MISCELLANEOUS) ×4 IMPLANT
DRAIN CHANNEL 32F RND 10.7 FF (WOUND CARE) ×4 IMPLANT
DRAPE CARDIOVASCULAR INCISE (DRAPES) ×2
DRAPE SLUSH/WARMER DISC (DRAPES) ×4 IMPLANT
DRAPE SRG 135X102X78XABS (DRAPES) ×2 IMPLANT
ELECT BLADE 4.0 EZ CLEAN MEGAD (MISCELLANEOUS) ×4
ELECT BLADE 6.5 EXT (BLADE) ×4 IMPLANT
ELECT CAUTERY BLADE 6.4 (BLADE) ×4 IMPLANT
ELECT REM PT RETURN 9FT ADLT (ELECTROSURGICAL) ×8
ELECTRODE BLDE 4.0 EZ CLN MEGD (MISCELLANEOUS) ×2 IMPLANT
ELECTRODE REM PT RTRN 9FT ADLT (ELECTROSURGICAL) ×4 IMPLANT
FEMORAL VENOUS CANN RAP (CANNULA) ×4 IMPLANT
GLOVE BIO SURGEON STRL SZ7.5 (GLOVE) ×16 IMPLANT
GLOVE BIOGEL PI IND STRL 6.5 (GLOVE) ×4 IMPLANT
GLOVE BIOGEL PI INDICATOR 6.5 (GLOVE) ×4
GLOVE ECLIPSE 7.0 STRL STRAW (GLOVE) ×4 IMPLANT
GOWN PREVENTION PLUS XLARGE (GOWN DISPOSABLE) ×20 IMPLANT
GOWN STRL NON-REIN LRG LVL3 (GOWN DISPOSABLE) ×16 IMPLANT
HEMOSTAT POWDER SURGIFOAM 1G (HEMOSTASIS) IMPLANT
HEMOSTAT SURGICEL 2X14 (HEMOSTASIS) ×4 IMPLANT
KIT BASIN OR (CUSTOM PROCEDURE TRAY) ×4 IMPLANT
KIT DILATOR VASC 18G NDL (KITS) ×4 IMPLANT
KIT ROOM TURNOVER OR (KITS) ×4 IMPLANT
KIT SUCTION CATH 14FR (SUCTIONS) ×4 IMPLANT
KIT VASOVIEW W/TROCAR VH 2000 (KITS) IMPLANT
LEAD PACING MYOCARDI (MISCELLANEOUS) ×4 IMPLANT
MARKER GRAFT CORONARY BYPASS (MISCELLANEOUS) ×4 IMPLANT
NS IRRIG 1000ML POUR BTL (IV SOLUTION) ×20 IMPLANT
PACK OPEN HEART (CUSTOM PROCEDURE TRAY) ×4 IMPLANT
PAD ELECT DEFIB RADIOL ZOLL (MISCELLANEOUS) ×4 IMPLANT
PENCIL BUTTON HOLSTER BLD 10FT (ELECTRODE) ×4 IMPLANT
SET CARDIOPLEGIA MPS 5001102 (MISCELLANEOUS) ×4 IMPLANT
SET MICROPUNCTURE 5F STIFF (MISCELLANEOUS) ×4 IMPLANT
SPONGE GAUZE 4X4 12PLY (GAUZE/BANDAGES/DRESSINGS) ×8 IMPLANT
SUT PROLENE 4 0 RB 1 (SUTURE) ×2
SUT PROLENE 4 0 SH DA (SUTURE) ×4 IMPLANT
SUT PROLENE 4-0 RB1 .5 CRCL 36 (SUTURE) ×2 IMPLANT
SUT PROLENE 7.0 RB 3 (SUTURE) ×12 IMPLANT
SUT PROLENE BLUE 7 0 (SUTURE) ×4 IMPLANT
SUT SILK  1 MH (SUTURE) ×14
SUT SILK 1 MH (SUTURE) ×14 IMPLANT
SUT STEEL 6MS V (SUTURE) IMPLANT
SUT STEEL SZ 6 DBL 3X14 BALL (SUTURE) ×4 IMPLANT
SUT VIC AB 1 CTX 36 (SUTURE) ×4
SUT VIC AB 1 CTX36XBRD ANBCTR (SUTURE) ×4 IMPLANT
SUT VIC AB 2-0 CTX 27 (SUTURE) ×8 IMPLANT
SUT VIC AB 3-0 X1 27 (SUTURE) ×8 IMPLANT
SUTURE E-PAK OPEN HEART (SUTURE) ×4 IMPLANT
SYRINGE 10CC LL (SYRINGE) ×4 IMPLANT
SYSTEM SAHARA CHEST DRAIN ATS (WOUND CARE) IMPLANT
TAPE CLOTH SURG 4X10 WHT LF (GAUZE/BANDAGES/DRESSINGS) ×4 IMPLANT
TOWEL OR 17X24 6PK STRL BLUE (TOWEL DISPOSABLE) ×8 IMPLANT
TOWEL OR 17X26 10 PK STRL BLUE (TOWEL DISPOSABLE) ×8 IMPLANT
TRAY FOLEY IC TEMP SENS 16FR (CATHETERS) ×4 IMPLANT
TUBE SUCT INTRACARD DLP 20F (MISCELLANEOUS) ×4 IMPLANT
TUBING ART PRESS 72  MALE/FEM (TUBING) ×2
TUBING ART PRESS 72 MALE/FEM (TUBING) ×2 IMPLANT
WATER STERILE IRR 1000ML POUR (IV SOLUTION) ×8 IMPLANT

## 2012-10-01 NOTE — Anesthesia Postprocedure Evaluation (Signed)
  Anesthesia Post-op Note  Patient: Steve [name]  Procedure(s) Performed: Procedure(s): CANNULATION FOR VENO-ARTERIAL ECMO (N/A) INTRAOPERATIVE TRANSESOPHAGEAL ECHOCARDIOGRAM (N/A)  Patient Location: ICU  Anesthesia Type:General  Level of Consciousness: sedated, unresponsive and Patient remains intubated per anesthesia plan  Airway and Oxygen Therapy: Patient remains intubated per anesthesia plan and Patient placed on Ventilator (see vital sign flow sheet for setting)  Post-op Pain: none  Post-op Assessment: Post-op Vital signs reviewed  Post-op Vital Signs: stable  Complications: No apparent anesthesia complications

## 2012-10-01 NOTE — Progress Notes (Signed)
Patient taken to cath lab; increased Levaphed to . Dr Gala Romney aware. Wife to remain in 2H13 for the time being.

## 2012-10-01 NOTE — Progress Notes (Addendum)
Dr Gala Romney now at bedside with Dr Mayford Knife to discuss patients status. After discussing with pt's wife, the plan is to take pt to cath lab for IABP and swan ganz catheter. Consent signed. Levophed started at this time due to decreased BP. Will continue to monitor closely.   Dawson Bills, RN

## 2012-10-01 NOTE — Progress Notes (Signed)
Report given to receiving RN, Selena Batten at [facility]. Informed patients wife of new room, [phone].  Dawson Bills, RN

## 2012-10-01 NOTE — Discharge Summary (Signed)
Patient ID: Steve [name],  MRN: [id number], DOB/AGE: 1950-05-11 62 y.o.  Admit date: 09/30/2012 Discharge date: 10/01/2012  Primary Care Provider: Sissy Hoff Primary Cardiologist: Armanda Magic, MD  Discharge Diagnoses Principal Problem:   Acute myocarditis, unspecified Active Problems:   Complete heart block   Cardiogenic shock   Acute respiratory failure with hypoxia   Ventricular tachycardia   Syncope   Lactic acidosis   URI, acute   Hypotension   NSTEMI (non-ST elevated myocardial infarction)   Acute renal failure   Hyperlipidemia   Orthostatic hypotension  Allergies Allergies  Allergen Reactions  . Simvastatin    Procedures  CXR 9.9.2014  IMPRESSION: Borderline cardiomegaly.  Central mild vascular congestion without convincing pulmonary edema.  No active disease. _____________   Temporary Pacemaker Wire Insertion 9.9.2014  Impression: 1. Complete heart block 2. Successful placement of temporary transvenous pacemaker _____________   2D Echocardiogram 9.9.2014  Study Conclusions  - Left ventricle: The cavity size was normal. The estimated   ejection fraction was 45%. Wall motion was normal; there   were no regional wall motion abnormalities. - Mitral valve: Mild regurgitation. - Left atrium: The atrium was mildly dilated. - Pericardium, extracardiac: A small, free-flowing   pericardial effusion was identified anterior to the heart.   There was no evidence of hemodynamic compromise. _____________  CXR 9.10.2014  IMPRESSION: Cardiac enlargement with increasing pulmonary vascular congestion since previous study.  There is also development of perihilar and interstitial infiltration.  Changes most likely represent edema. Pneumonia not excluded. _____________  Cardiac Catheterization 9.10.2014  Hemodynamic Findings: Central aortic pressure: 92/67 Left ventricular pressure: 89/24/34  Angiographic Findings:  Left main: No obstructive disease.     Left Anterior Descending Artery: Large caliber vessel that courses to the apex. There is a mild plaque in the proximal and mid LAD. The distal LAD has a 30% stenosis before the vessel reaches the apex. The diagonal branch is moderate in caliber with no obstructive disease.   Circumflex Artery: Large, caliber co-dominant vessel with early intermediate branch, several small OM branches followed by a moderate caliber OM branch and a left sided posterolateral branch. Mild plaque noted in the AV groove Circumflex. No obstructive disease.   Right Coronary Artery: Moderate caliber co-dominant vessel with mild plaque in the proximal and mid vessel. The distal vessel has a 50% stenosis which does not appear to be flow limiting. The RCA supplies a small PDA.  Left Ventricular Angiogram: Deferred.   Impression: 1. Non-obstructive CAD 2. Shock secondary to possible myopericarditis. Currently stable hemodynamically on dopamine.   _____________   CXR 9.10.2014  IMPRESSION:  1.  Tip of endotracheal tube 5.1 cm above the carina. 2.  Left central venous line tip overlies the lower SVC. 3.  Worsening of opacity in the right mid and lower lung field most consistent with pneumonia. 4.  Suspect overlapping interstitial edema _____________   Right Heart Cardiac Catheterization and IABP Placement 9.10.2014  Findings:  On noriepinephrine 20 mcg/kg/min  RA = 18 RV = 41/14/20 PA = 48/24 (34) PCW = 27   Fick cardiac output/index = 3.8/1.7 PVR = < 1.0 WU FA sat = 99% PA sat = 54%, 56%  Assessment: 1. Cardiogenic shock, rapidly progressive 2. Probable viral myocarditis  Plan/Discussion:  He has had a rapidly progressive course. Have discussed with Dr. Donata Clay and the [facility] team. Will take to OR for initiation for cardiopulmonary bypass.  _____________   History of Present Illness  62 year old male without prior cardiac history.  He was in his usual state of health until Saturday, 09/27/2012,  when he began to experience cold of flulike symptoms including sneezing, body aches, subjective fever, chills, and cough. Symptoms persisted into Sunday, September 7, and the patient presented to a local urgent care where he was told he had a cold. He continued to feel poorly throughout the weekend and was noted to have intermittent spells of sudden diaphoresis as well as brief episodes of unresponsiveness and syncope. He did not sustain any injuries. Patient's wife took him to see his primary care provider on September 9, and there, he was found to be in complete heart block with a ventricular escape rhythm of 25-30 beats per minute.  EMS was called the patient was taken to the [facility] emergency department for further evaluation. In the ED, he was hypertensive with pressures in the 75-80 mmHg range. He was afebrile. Intravenous dopamine was initiated for pressure support a blood cultures were obtained. Given ongoing heart block, patient was taken emergently to the cardiac catheterization laboratory for temporary wire placement.  Hospital Course  Patient underwent successful placement of a temporary pacemaker wire on the afternoon of September 9. Post procedure, he was monitored in the coronary intensive care unit.  His troponin was found to be elevated at greater than 20. ProBNP was markedly elevated at 28,521. Pro calcitonin is elevated 0.89 with a lactate of 6.58. Creatinine was found to be mildly elevated at 2.02. A 2-D echocardiogram was carried out and showed an EF of 45%.   In the early morning hours of September 10, patient had progressive worsening of respiratory status. ECG did not show any acute ST segment changes. Regardless, with marked elevation of troponin and LV dysfunction, decision was made to pursue emergent diagnostic catheterization. This was performed earlier this morning revealing mild nonobstructive coronary artery disease. Was felt that patient's presentation was most consistent with  myopericarditis. In the setting of hypotension, he was maintained on dopamine therapy was transported back to the coronary intensive care unit. There, he had progressive dyspnea and respiratory failure was subsequently intubated and sedated.  Unfortunately, he continued to have progressive hypotension along with A-V dissociation and ventricular tachycardia. Patient was briefly treated with amiodarone which resulted in further hypotension. Repeat echocardiography was performed at the bedside this morning revealing an EF of 15%. In the setting of ventricle tachycardia and heart block, electrophysiology was consulted as was the inpatient heart failure team. Given progressive critical illness, he was taken to the cardiac cath lab for placement of Swan-Ganz catheter and intra-aortic balloon pump. He also required cardioversion for ongoing ventricular tachycardia.  Right heart catheterization was performed and showed elevated right heart pressures and reduced cardiac output.  Due to rapidly progressing decompensation, cardiothoracic surgery was called for consideration of cardiopulmonary bypass with plan for subsequent transfer to [facility] for further management.  Patient was seen by cardiothoracic surgery and has been taken to the OR for ECMO.  Post-op, he will be transported to [facility].  Discharge Vitals Blood pressure 69/49, pulse 91, temperature 98.9 F (37.2 C), temperature source Oral, resp. rate 29, height 5\' 8"  (1.727 m), weight 226 lb 6.6 oz (102.7 kg), SpO2 96.00%.  Filed Weights   09/30/12 1546 09/30/12 1942  Weight: 226 lb 6.6 oz (102.7 kg) 226 lb 6.6 oz (102.7 kg)   Labs  CBC  Recent Labs  09/30/12 1826 10/01/12 0343  WBC 9.0 12.3*  NEUTROABS 6.9 9.7*  HGB 12.0* 12.6*  HCT 34.4* 34.7*  MCV  87.3 86.5  PLT 158 201   Basic Metabolic Panel  Recent Labs  09/30/12 2240 10/01/12 0343  NA 126* 127*  K 3.8 4.0  CL 90* 91*  CO2 20 19  GLUCOSE 145* 141*  BUN 48* 49*  CREATININE 1.73* 1.20    CALCIUM 7.9* 7.7*  MG 2.0 1.9   Liver Function Tests  Recent Labs  10/01/12 0343 10/01/12 0450  AST 539* 546*  ALT 362* 362*  ALKPHOS 40 40  BILITOT 0.6 0.6  PROT 5.8* 6.0  ALBUMIN 3.0* 3.0*   Cardiac Enzymes  Recent Labs  09/30/12 1449 09/30/12 2240 10/01/12 0345  CKTOTAL  --  2769* 2550*  CKMB  --  123.5* 111.1*  TROPONINI >20.00* >20.00* >20.00*   Hemoglobin A1C  Recent Labs  09/30/12 1449  HGBA1C 6.3*   Thyroid Function Tests  Recent Labs  09/30/12 1449  TSH 3.878   Disposition  Pt is being discharged home today in good condition.  Follow-up Plans & Appointments  To be determined  Discharge Medications    Medication List    STOP taking these medications       atorvastatin 40 MG tablet  Commonly known as:  LIPITOR     lisinopril-hydrochlorothiazide 20-25 MG per tablet  Commonly known as:  PRINZIDE,ZESTORETIC     naproxen sodium 220 MG tablet  Commonly known as:  ANAPROX      TAKE these medications       amiodarone (NEXTERONE PREMIX) 360 mg/200 mL dextrose 360 MG/200ML Soln  Inject 33.33 mL/hr into the vein continuous.     aspirin 81 MG EC tablet  Take 1 tablet (81 mg total) by mouth daily.     dextrose 5 % SOLN 250 mL with norepinephrine 1 MG/ML SOLN 8 mg  Inject 2-50 mcg/min into the vein continuous.     fentaNYL Soln  Commonly known as:  SUBLIMAZE  Inject 25-100 mcg into the vein every 6 (six) hours as needed (to achieve analgesia).     furosemide 10 MG/ML injection  Commonly known as:  LASIX  Inject 4 mLs (40 mg total) into the vein every 12 (twelve) hours.     midazolam 1 mg/mL Soln  Commonly known as:  VERSED  Inject 1-2 mg into the vein every 2 (two) hours as needed.     milrinone 20 MG/100ML Soln infusion  Commonly known as:  PRIMACOR  Inject 12.8375 mcg/min into the vein continuous.     pantoprazole 40 MG injection  Commonly known as:  PROTONIX  Inject 40 mg into the vein daily.     sodium chloride 0.9 %  SOLN 200 mL with fentaNYL 0.05 MG/ML SOLN 2,500 mcg  Inject 10 mcg/hr into the vein continuous.     sodium chloride 0.9 % SOLN 40 mL with midazolam 5 MG/ML SOLN 50 mg  Inject 1-10 mg/hr into the vein continuous.       Duration of Discharge Encounter   Greater than 30 minutes including physician time.  Signed, Nicolasa Ducking NP 10/01/2012, 1:31 PM

## 2012-10-01 NOTE — Progress Notes (Signed)
  Echocardiogram 2D Echocardiogram (stat) has been performed.  Steve [name] 10/01/2012, 8:17 AM

## 2012-10-01 NOTE — Progress Notes (Signed)
  Echocardiogram Echocardiogram Transesophageal (OR) has been performed.  Nelida Mandarino 10/01/2012, 12:12 PM

## 2012-10-01 NOTE — Progress Notes (Signed)
RT Note: RT late entry. Placed patient on 20 PPM nitric per DR Donata Clay. Placed on nitric in OR on OR ventilator.

## 2012-10-01 NOTE — OR Nursing (Signed)
Family member was updated by Janalyn Harder RN MSN on patients condition and plan to transfer to [facility] at 1212.

## 2012-10-01 NOTE — Progress Notes (Deleted)
Paged cards fellow for pt SBP trending in low 80s. Orders received to titrate up on dopamine.  [name], Steve [name]

## 2012-10-01 NOTE — Consult Note (Signed)
ELECTROPHYSIOLOGY CONSULT NOTE  Patient ID: Steve [name] MRN: [id number], DOB/AGE: [birth year]   Admit date: 09/30/2012 Date of Consult: 10/01/2012  Primary Physician: Sissy Hoff, MD Primary Cardiologist: Armanda Magic, MD Reason for Consultation: Complete heart block  History of Present Illness Kevork Joyce is a 62 y.o. male with HTN, dyslipidemia, DM and prostate CA s/p seed implant who presented to [facility] ED yesterday with syncope and cold symptoms. He is currently intubated and sedated, unable to provide history. His history is obtained from his chart. Apparently he began feeling sick on Saturday with symptoms of body aches, sneezing, coughing and fever. He was evaluated at an urgent care center on Sunday at which time he was told he has a cold. He felt worse and developed generalized weakness. His wife reports he experienced intermittent unresponsiveness at home yesterday which prompted her to schedule an appointment with his PCP. He had recurrent syncope at Dr. Merita Norton office and EMS was called. He was found to be in complete heart block with a ventricular escape rhythm in the 20s. On arrival to the ED he was hypotensive with a SBP of 75 mmHg and cyanotic appearing. He denied chest pain or SOB. He was afebrile on admission. He was started on IV dopamine for pressure support and blood cultures were obtained. He went emergently for temporary pacemaker placement. Troponin >20 and proBNP significantly elevated. An echo was done which revealed LVEF 45% without regional WMAs. He was taken back to the cath lab for definitive evaluation of his coronary anatomy. This revealed non-obstructive CAD. He remained hypoxic through the evening and around 7 AM today developed acute respiratory failure requiring intubation. Given his global laboratory abnormalities suggestive of end-organ hypoperfusion, elevated lactic acid, echo and cath findings along with recent viral illness/fever, myocarditis or septic shock are  suspected.  Past Medical History Past Medical History  Diagnosis Date  . Hypertension   . Hyperlipidemia   . Prediabetes   . Cancer     prostate CA s/p seed implant  . Dyslipidemia   . Vitamin D deficiency   . Allergic rhinitis   . Arthritis     OA of knee  . Colon polyps   . Diverticulosis     Past Surgical History Past Surgical History  Procedure Laterality Date  . Knee arthroscopy      Allergies/Intolerances Allergies  Allergen Reactions  . Simvastatin     Current Home Medications Medications Prior to Admission  Medication Sig Dispense Refill  . atorvastatin (LIPITOR) 40 MG tablet Take 40 mg by mouth daily.      Marland Kitchen lisinopril-hydrochlorothiazide (PRINZIDE,ZESTORETIC) 20-25 MG per tablet Take 1 tablet by mouth daily.      . naproxen sodium (ANAPROX) 220 MG tablet Take 220 mg by mouth 2 (two) times daily with a meal.        Inpatient Medications . antiseptic oral rinse  1 application Mouth Rinse QID  . aspirin EC  81 mg Oral Daily  . chlorhexidine  15 mL Mouth/Throat BID  . furosemide      . furosemide  40 mg Intravenous Q12H  . furosemide  80 mg Intravenous Once  . influenza vac split quadrivalent PF  0.5 mL Intramuscular Tomorrow-1000  . pantoprazole (PROTONIX) IV  40 mg Intravenous Q24H  . sodium chloride  3 mL Intravenous Q12H   . fentaNYL infusion INTRAVENOUS    . heparin 1,200 Units/hr (10/01/12 0217)  . midazolam (VERSED) infusion    . norepinephrine (LEVOPHED) Adult infusion  Family History History reviewed. No pertinent family history.   Social History Social History  . Marital Status: Married   Social History Main Topics  . Smoking status: Former Smoker -- 0.50 packs/day for 25 years    Types: Cigarettes    Quit date: 09/09/2012  . Smokeless tobacco: Never Used  . Alcohol Use: No  . Drug Use: No   Review of Systems As per HPI; Unable to obtain otherwise as patient is intubated and sedated  Physical Exam  Vitals: Blood pressure  120/83, pulse 112, temperature 98.1 F (36.7 C), temperature source Oral, resp. rate 26, height 5\' 8"  (1.727 m), weight 226 lb 6.6 oz (102.7 kg), SpO2 95.00%.  General: Well developed, ill appearing 62 y.o. male intubated and sedated. HEENT: Normocephalic, atraumatic. ET tube in place. Neck: Supple. JVD non discernible Lungs: Mechanical ventilation. Coarse breath sounds throughout.  Heart: Regular. S1, S2 present. No murmurs. Abdomen: Soft, non-distended. BS present x 4 quadrants. No hepatosplenomegaly.  Extremities: No clubbing, cyanosis or edema.  Neuro: Deferred. Patient intubated and sedated. Skin: Intact. cold and dry. No rashes or petechiae in exposed areas.   Labs  Recent Labs  09/30/12 1449 09/30/12 2240 10/01/12 0345  CKTOTAL  --  2769* 2550*  CKMB  --  123.5* 111.1*  TROPONINI >20.00* >20.00* >20.00*   Lab Results  Component Value Date   WBC 12.3* 10/01/2012   HGB 12.6* 10/01/2012   HCT 34.7* 10/01/2012   MCV 86.5 10/01/2012   PLT 201 10/01/2012    Recent Labs Lab 10/01/12 0343 10/01/12 0450  NA 127*  --   K 4.0  --   CL 91*  --   CO2 19  --   BUN 49*  --   CREATININE 1.20  --   CALCIUM 7.7*  --   PROT 5.8* 6.0  BILITOT 0.6 0.6  ALKPHOS 40 40  ALT 362* 362*  AST 539* 546*  GLUCOSE 141*  --    ProBNP - 28,521 Magnesium - 2.0 Lactic acid - 6.58  Recent Labs  09/30/12 1449  TSH 3.878    Recent Labs  09/30/12 1449  INR 1.13    Radiology/Studies Dg Chest Port 1 View 10/01/2012   IMPRESSION: Cardiac enlargement with increasing pulmonary vascular congestion since previous study.  There is also development of perihilar and interstitial infiltration.  Changes most likely represent edema. Pneumonia not excluded.    Original Report Authenticated By: Burman Nieves, M.D.   Dg Chest Port 1 View 09/30/2012   IMPRESSION: Borderline cardiomegaly.  Central mild vascular congestion without convincing pulmonary edema.  No active disease.    Original Report  Authenticated By: Natasha Mead, M.D.   Echocardiogram  Study Conclusions - Left ventricle: The cavity size was normal. The estimated ejection fraction was 45%. Wall motion was normal; there were no regional wall motion abnormalities. - Mitral valve: Mild regurgitation. - Left atrium: The atrium was mildly dilated. - Pericardium, extracardiac: A small, free-flowing pericardial effusion was identified anterior to the heart. There was no evidence of hemodynamic compromise.  Cardiac catheterization Hemodynamic Findings:  Central aortic pressure: 92/67  Left ventricular pressure: 89/24/34  Angiographic Findings:  Left main: No obstructive disease.  Left Anterior Descending Artery: Large caliber vessel that courses to the apex. There is a mild plaque in the proximal and mid LAD. The distal LAD has a 30% stenosis before the vessel reaches the apex. The diagonal branch is moderate in caliber with no obstructive disease.  Circumflex Artery: Large, caliber co-dominant  vessel with early intermediate branch, several small OM branches followed by a moderate caliber OM branch and a left sided posterolateral branch. Mild plaque noted in the AV groove Circumflex. No obstructive disease.  Right Coronary Artery: Moderate caliber co-dominant vessel with mild plaque in the proximal and mid vessel. The distal vessel has a 50% stenosis which does not appear to be flow limiting. The RCA supplies a small PDA.  Left Ventricular Angiogram: Deferred.  Impression:  1. Non-obstructive CAD  2. Shock secondary to possible myopericarditis. Currently stable hemodynamically on dopamine.    ECGs described below   Assessment and Plan  1. Presumed acute fulminant viral myocarditis >>EF of 40-45% yesterday to 10% today; catheterization last p.m. demonstrating no significant obstruction  2. Cardiogenic shock secondary to #1  3. Complete heart block on presentation with wide QRS escape  4. Acute systolic heart failure  with subsequent respiratory failure  5. History of diabetes, hypertension and prostate cancer status post seed implantation   The patient presented with complete heart block. This morning he developed a wide-complex tachycardia which upon careful review of tracings demonstrates sinus tachycardia with A-V dissociation. Ventricular morphology is consistent with ventricular tachycardia. The patient received low-dose amiodarone which was unfortunately temporally associated with a fall in blood pressure requiring pressors.  However, concurrent with that also was a slowing in his ventricular rate 130s--90s.  He is currently being prepped for balloon pump support; his heart rate control will be critical to allow for augmentation. We'll try again using amiodarone; however, at this point we'll try without a bolus to see if we can get drug on board without aggravating his hypotension. In the event that he develops recurrent tachycardia, I would cardiovert him anticipating that his complete heart block is still present. His transvenous pacer can be used to control his heart rate. It is unlikely that hemodynamics of ventricular pacing are considerably worse than that ventricular tachycardia even at the same rate.

## 2012-10-01 NOTE — Progress Notes (Signed)
CCM paged about pt's worsening respiratory status. Pt went from 3L [employer] to nonrebreather within the past 30 mins. Elink informed. ABG ordered.  Perkins, [place] [name]

## 2012-10-01 NOTE — Progress Notes (Signed)
eLink Physician-Brief Progress Note Patient Name: Asmar Brozek DOB: 1950/02/28 MRN: [id number]  Date of Service  10/01/2012   HPI/Events of Note  Anxiety   eICU Interventions  Ativan 1 mg IV times one dose   Intervention Category Minor Interventions: Agitation / anxiety - evaluation and management  Omarrion Carmer 10/01/2012, 5:50 AM

## 2012-10-01 NOTE — Interval H&P Note (Signed)
History and Physical Interval Note:  10/01/2012 4:33 AM  Maurine Minister Worlds  has presented today for cardiac cath with the diagnosis of NSTEMI.  The various methods of treatment have been discussed with the patient and family. After consideration of risks, benefits and other options for treatment, the patient has consented to  Procedure(s): LEFT HEART CATHETERIZATION WITH CORONARY ANGIOGRAM (N/A) as a surgical intervention .  The patient's history has been reviewed, patient examined, no change in status, stable for surgery.  I have reviewed the patient's chart and labs.  Questions were answered to the patient's satisfaction.    Cath Lab Visit (complete for each Cath Lab visit)  Clinical Evaluation Leading to the Procedure:   ACS: yes  Non-ACS:    Anginal Classification: CCS IV  Anti-ischemic medical therapy: No Therapy  Non-Invasive Test Results: No non-invasive testing performed  Prior CABG: No previous CABG        MCALHANY,CHRISTOPHER

## 2012-10-01 NOTE — Discharge Summary (Signed)
Patient seen and examined independently. Gilford Raid, NP note reviewed carefully - agree with his assessment and plan. I have edited the note based on my findings. See my cath note for further details. Agree with transfer for [facility] for ECMO support and possible advanced therapies.   Steve [name] 8:49 PM

## 2012-10-01 NOTE — Brief Op Note (Signed)
09/30/2012 - 10/01/2012  2:59 PM  PATIENT:  Steve [name]  62 y.o. male  PRE-OPERATIVE DIAGNOSIS: Acute Myocarditis with cardiogenic shock  POST-OPERATIVE DIAGNOSIS: Acute myocarditis with cardiogenic shock  PROCEDURE:  Procedure(s): CANNULATION FOR VENO-ARTERIAL ECMO (N/A) INTRAOPERATIVE TRANSESOPHAGEAL ECHOCARDIOGRAM (N/A)  SURGEON:  Surgeon(s) and Role:    * Kerin Perna, MD - Primary  PHYSICIAN ASSISTANT: None  ASSISTANTS: None   ANESTHESIA:   general  EBL:  Total I/O In: 250 [IV Piggyback:250] Out: -   BLOOD ADMINISTERED:none  DRAINS: none   LOCAL MEDICATIONS USED:  NONE  SPECIMEN:  No Specimen  DISPOSITION OF SPECIMEN:  N/A  COUNTS:  YES  TOURNIQUET:  * No tourniquets in log *  DICTATION: .Dragon Dictation  PLAN OF CARE: Transported to [facility] by ECMO transport team  PATIENT DISPOSITION:  ICU - intubated and critically ill.   Delay start of Pharmacological VTE agent (>24hrs) due to surgical blood loss or risk of bleeding: Yes

## 2012-10-01 NOTE — Progress Notes (Addendum)
SUBJECTIVE:  Event of night reviewed.  Developed progressive hemodynamic instability and increased O2 demands and underwent cath showing nonobstructive ASCAD and markedly elevated LVEDP of .  Started on IV Lasix.  Became progressively more agitated and ABG showed metabolic acidosis and chest xray with severe CHF now intubated and sedated.    OBJECTIVE:   Vitals:   Filed Vitals:   10/01/12 0800 10/01/12 0834 10/01/12 0847 10/01/12 0849  BP:  86/55  69/49  Pulse:   95 91  Temp: 98.9 F (37.2 C)     TempSrc: Oral     Resp:  40  29  Height:      Weight:      SpO2:       I&O's:   Intake/Output Summary (Last 24 hours) at 10/01/12 2314 Last data filed at 10/01/12 1102  Gross per 24 hour  Intake    444 ml  Output    250 ml  Net    194 ml   TELEMETRY: Reviewed telemetry pt in wide complex tachycardia     PHYSICAL EXAM General: Ill appearing, intubated and sedeated Head: Eyes PERRLA, No xanthomas.   Normal cephalic and atramatic  Lungs:  Diffuse crackles Heart:   tachycardic S1 S2 Pulses are 2+ & equal. Abdomen: Bowel sounds are positive, abdomen soft and non-tender without masses  Extremities:   No clubbing, cyanosis or edema.  DP +1    LABS: Basic Metabolic Panel:  Recent Labs  16/10/96 2240 10/01/12 0343  10/01/12 1220 10/01/12 1244  NA 126* 127*  < > 134* 135  K 3.8 4.0  < > 3.2* 3.1*  CL 90* 91*  --   --   --   CO2 20 19  --   --   --   GLUCOSE 145* 141*  < > 177* 177*  BUN 48* 49*  --   --   --   CREATININE 1.73* 1.20  --   --   --   CALCIUM 7.9* 7.7*  --   --   --   MG 2.0 1.9  --   --   --   < > = values in this interval not displayed. Liver Function Tests:  Recent Labs  10/01/12 0343 10/01/12 0450  AST 539* 546*  ALT 362* 362*  ALKPHOS 40 40  BILITOT 0.6 0.6  PROT 5.8* 6.0  ALBUMIN 3.0* 3.0*   No results found for this basename: LIPASE, AMYLASE,  in the last 72 hours CBC:  Recent Labs  09/30/12 1826 10/01/12 0343  10/01/12 1220  10/01/12 1244  WBC 9.0 12.3*  --   --   --   NEUTROABS 6.9 9.7*  --   --   --   HGB 12.0* 12.6*  < > 8.5* 8.2*  HCT 34.4* 34.7*  < > 25.0* 24.0*  MCV 87.3 86.5  --   --   --   PLT 158 201  --   --   --   < > = values in this interval not displayed. Cardiac Enzymes:  Recent Labs  09/30/12 1449 09/30/12 2240 10/01/12 0345  CKTOTAL  --  2769* 2550*  CKMB  --  123.5* 111.1*  TROPONINI >20.00* >20.00* >20.00*   BNP: No components found with this basename: POCBNP,  D-Dimer: No results found for this basename: DDIMER,  in the last 72 hours Hemoglobin A1C:  Recent Labs  09/30/12 1449  HGBA1C 6.3*   Fasting Lipid Panel: No results found for this basename:  CHOL, HDL, LDLCALC, TRIG, CHOLHDL, LDLDIRECT,  in the last 72 hours Thyroid Function Tests:  Recent Labs  09/30/12 1449  TSH 3.878   Anemia Panel: No results found for this basename: VITAMINB12, FOLATE, FERRITIN, TIBC, IRON, RETICCTPCT,  in the last 72 hours Coag Panel:   Lab Results  Component Value Date   INR 1.13 09/30/2012    RADIOLOGY: Dg Chest Port 1 View  10/01/2012   *RADIOLOGY REPORT*  Clinical Data: Evaluate central line placement and endotracheal tube placement  PORTABLE CHEST - 1 VIEW  Comparison: Portable chest x-ray of 10/01/2012  Findings: The tip of the endotracheal.  Is approximately 5.1 cm above the carina.  A left central venous line is noted with the tip overlying the lower SVC.  There has been worsening of probable pneumonia in the right mid lung field as well as possible superimposed interstitial edema.  Any follow-up is recommended.  No definite effusion is seen.  IMPRESSION:  1.  Tip of endotracheal tube 5.1 cm above the carina. 2.  Left central venous line tip overlies the lower SVC. 3.  Worsening of opacity in the right mid and lower lung field most consistent with pneumonia. 4.  Suspect overlapping interstitial edema.   Original Report Authenticated By: Dwyane Dee, M.D.   Dg Chest Port 1  View  10/01/2012   *RADIOLOGY REPORT*  Clinical Data: Respiratory distress.  PORTABLE CHEST - 1 VIEW  Comparison: 09/30/2012  Findings: Mild cardiac enlargement.  Mild developing pulmonary vascular congestion.  Perihilar infiltration and interstitial changes are new since previous study.  Changes likely represent edema but interstitial pneumonia can also have this appearance.  No blunting of costophrenic angles.  No pneumothorax.  IMPRESSION: Cardiac enlargement with increasing pulmonary vascular congestion since previous study.  There is also development of perihilar and interstitial infiltration.  Changes most likely represent edema. Pneumonia not excluded.   Original Report Authenticated By: Burman Nieves, M.D.   Dg Chest Port 1 View  09/30/2012   *RADIOLOGY REPORT*  Clinical Data: Hypertension, fall  PORTABLE CHEST - 1 VIEW  Comparison: 09/11/2004  Findings: Borderline cardiomegaly.  Central mild vascular congestion without convincing pulmonary edema.  No acute infiltrate or pleural effusion.  IMPRESSION: Borderline cardiomegaly.  Central mild vascular congestion without convincing pulmonary edema.  No active disease.   Original Report Authenticated By: Natasha Mead, M.D.   Dg Shoulder Left Port  09/30/2012   CLINICAL DATA:  Fall. Shoulder injury and pain.  EXAM: PORTABLE LEFT SHOULDER - 2+ VIEW  COMPARISON:  None.  FINDINGS: There is no evidence of fracture or dislocation. There is no evidence of arthropathy or other focal bone abnormality. Soft tissues are unremarkable.  IMPRESSION: Negative.   Electronically Signed   By: Myles Rosenthal   On: 09/30/2012 21:39    ASSESSMENT:  1. Acute hypotension with shock secondary to  cardiogenic from complete heart block and fulminant myopericarditis. Still requiring pressor support to maintain a SBP of despite temp pacing at 60bpm. His initial echo showed mild LV dysfunction EF 45% but repeat echo due to declining status shows worsening LVF EF 15-20% 2.  Elevated troponin in the setting of viral syndrome and globally reduced LVF and small pericardial effusion, clinically most consistent with viral myopericarditis. Cath with nonobstructive ASCAD 3. Fulminant acute myocarditis most likley viral in etiology 4. Hypokalemia  5. Acute kidney injury secondary to #1  6. Transaminitis secondary to shock  7. Complete heart block s/p temp pacer - heart block could be  explained by myocarditis which should resolve with resolution of myocarditis  8. Syncope secondary to #1 and #8  9.  Wide complex tachcyardia - NSVT with runs of AIVR  PLAN:  1.  Advanced Heart Failure consult obtained and patient will be taken to cath lab for IABP and Swan placement 2.  Replete potassium 3.  CVTS consult Dr. Donata Clay obtained 4. continue pressor support 5.  IV Amio as BP tolerates  A total of 90 minutes of critical care time was spent in direct patient care.  Quintella Reichert, MD  10/01/2012  11:14 PM

## 2012-10-01 NOTE — Progress Notes (Signed)
Conference call with Dr. Bishop Dublin, Dr. Jamse Mead ([facility] ECMO coordinator), Caryn Bee ([facility] RN), and myself to report off pt's current status.   Dawson Bills, RN

## 2012-10-01 NOTE — Progress Notes (Signed)
PULMONARY  / CRITICAL CARE MEDICINE  Name: Steve [name] MRN: [id number] DOB: 05/14/1950    ADMISSION DATE:  09/30/2012 CONSULTATION DATE:  10/01/2012  REFERRING MD :  Cardiology PRIMARY SERVICE:  Cardiology  CHIEF COMPLAINT:  Respiratory distress  BRIEF PATIENT DESCRIPTION: 62 yo admitted with acute myocarditis and complete heart block in setting of viral syndrome.  Developed hypoxemia and metabolic acidosis after returning form uncomplicated cardiac catheterization. Intubated.  SIGNIFICANT EVENTS / STUDIES:  9/9    TTE >>> EF 45%, pericardial effusion without tamponade 9/10  Cardiac cath >>> Non-obstructive CAD, transvenous pacer placed  LINES / TUBES: OETT 9/10 >>> OGT 9/10 >>> Foley 9/10 >>> L [place] CVL 9/10 >>> R fem venus sheath >>>  CULTURES: 9/9 Blood >>> 9/9 Urine >>>  ANTIBIOTICS: Zosyn 9/10 >>> Vancomycin 9/10 >>> Levaquin 9/10 >>>  INTERVAL HISTORY:  Acute pulmonary edema.  Respiratory distress.  Intubated.  VITAL SIGNS: Temp:  [97.5 F (36.4 C)-98.1 F (36.7 C)] 98.1 F (36.7 C) (09/10 0000) Pulse Rate:  [26-112] 112 (09/10 0645) Resp:  [12-34] 26 (09/10 0645) BP: (80-120)/(51-83) 120/83 mmHg (09/10 0645) SpO2:  [90 %-100 %] 95 % (09/10 0645) FiO2 (%):  [100 %] 100 % (09/10 0645) Weight:  [102.7 kg (226 lb 6.6 oz)] 102.7 kg (226 lb 6.6 oz) (09/09 1942)  HEMODYNAMICS:   VENTILATOR SETTINGS: Vent Mode:  [-] PRVC FiO2 (%):  [100 %] 100 % Set Rate:  [26 bmp] 26 bmp Vt Set:  [550 mL] 550 mL PEEP:  [5 cmH20] 5 cmH20 Plateau Pressure:  [20 cmH20] 20 cmH20  INTAKE / OUTPUT: Intake/Output     09/09 0701 - 09/10 0700 09/10 0701 - 09/11 0700   I.V. (mL/kg) 242.1 (2.4)    IV Piggyback 50    Total Intake(mL/kg) 292.1 (2.8)    Urine (mL/kg/hr) 200    Total Output 200     Net +92.1          Urine Occurrence 2 x     PHYSICAL EXAMINATION: General:  Appears acutely ill, mechanically ventilated, synchronous; before intubation - agitated, in acute distress,  with increased work of breathing Neuro:  Encephalopathic, nonfocal, cough / gag diminished HEENT:  PERRL, OETT / OGT Cardiovascular:  RRR, no m/r/g Lungs:  Bilateral diminished air entry, no w/r/r Abdomen:  Soft, nontender, bowel sounds diminished Musculoskeletal:  Moves all extremities, no edema Skin:  Intact  LABS:  Recent Labs Lab 09/30/12 1313 09/30/12 1449 09/30/12 1501 09/30/12 1825 09/30/12 1826 09/30/12 2240 10/01/12 0317 10/01/12 0343 10/01/12 0344 10/01/12 0345 10/01/12 0450 10/01/12 0628  HGB 13.1  --   --   --  12.0*  --   --  12.6*  --   --   --   --   WBC 11.8*  --   --   --  9.0  --   --  12.3*  --   --   --   --   PLT 173  --   --   --  158  --   --  201  --   --   --   --   NA 127* 128*  --   --   --  126*  --  127*  --   --   --   --   K 3.3* 3.2*  --   --   --  3.8  --  4.0  --   --   --   --   CL 89*  88*  --   --   --  90*  --  91*  --   --   --   --   CO2 17* 15*  --   --   --  20  --  19  --   --   --   --   GLUCOSE 145* 145*  --   --   --  145*  --  141*  --   --   --   --   BUN 44* 44*  --   --   --  48*  --  49*  --   --   --   --   CREATININE 2.01* 2.02*  --   --   --  1.73*  --  1.20  --   --   --   --   CALCIUM 8.2* 8.3*  --   --   --  7.9*  --  7.7*  --   --   --   --   MG  --   --   --   --   --  2.0  --  1.9  --   --   --   --   AST 274* 274*  --   --   --   --   --  539*  --   --  546*  --   ALT 83* 82*  --   --   --   --   --  362*  --   --  362*  --   ALKPHOS 41 40  --   --   --   --   --  40  --   --  40  --   BILITOT 0.5 0.4  --   --   --   --   --  0.6  --   --  0.6  --   PROT 6.4 6.3  --   --   --   --   --  5.8*  --   --  6.0  --   ALBUMIN 3.2* 3.1*  --   --   --   --   --  3.0*  --   --  3.0*  --   APTT  --  32  --   --   --   --   --   --   --   --   --   --   INR  --  1.13  --   --   --   --   --   --   --   --   --   --   LATICACIDVEN  --   --  6.58* 2.6*  --   --   --   --  2.8*  --   --   --   TROPONINI  --  >20.00*  --   --    --  >20.00*  --   --   --  >20.00*  --   --   PROCALCITON 0.89  --   --   --   --   --   --  0.83  --   --   --   --   PROBNP  --  28521.0*  --   --   --   --   --   --   --   --   --   --   PHART  --   --   --   --   --   --  7.422  --   --   --   --  7.181*  PCO2ART  --   --   --   --   --   --  25.6*  --   --   --   --  33.7*  PO2ART  --   --   --   --   --   --  74.0*  --   --   --   --  79.0*    Recent Labs Lab 09/30/12 1428  GLUCAP 153*   CXR:  9/10 >>> Hardware in good position, dense bilateral airspace disease  ASSESSMENT / PLAN:  PULMONARY A:  Acute respiratory failure secondary to pulmonary edema.  Less likely pneumonia (CAP vs aspiration). P:   Gaol SpO2>92, pH>7.30 Full mechanical support, high Ve Daily SBT Trend ABG / CXR  CARDIOVASCULAR A: Acute myocarditis in setting of viral syndrome.  Non-ischemic cardiomyopathy.  Acute systolic CHF.  Cardiogenic shock.  Doubt sepsis / septic shock.  Pericardial effusion without tamponade.  Significant troponin leak in setting of myocarditis vs ACS. P:  Cardiology following Goal MAP>60 Trend troponin / lactate SvO2 D/c Dopamine Levophed gtt PRN Transvenous pacer placed by Cardiology Repeat TTE  RENAL A:  AKI. P:   Goal CVP<3 Trend BMP Lasix 40 q12h IVF@KVO   GASTROINTESTINAL A:  Acute liver failure in setting of shock. P:   NPO as intubated TF if remains intubated > 24 hours Add Protonix for GI Px Trend LFT  HEMATOLOGIC A:  No active issues. P:  Trend CBC D/c Heparin gtt Heparin for DVT Px  INFECTIOUS A:  Viral myocarditis.  Possible CAP / aspiration pneumonia. P:   Cultures and antibiotics as above Low threshold to d/c abx if cxr improves with diuresis  ENDOCRINE  A:  Prediabetes. P:   SSI  NEUROLOGIC A:  Acute encephalopathy. P:   Goal RASS 0 to -1 Fentanyl / Versed gtt  I have personally obtained a history, examined the patient, evaluated laboratory and imaging results, formulated the  assessment and plan and placed orders.  CRITICAL CARE:  The patient is critically ill with multiple organ systems failure and requires high complexity decision making for assessment and support, frequent evaluation and titration of therapies, application of advanced monitoring technologies and extensive interpretation of multiple databases. Critical Care Time devoted to patient care services described in this note is 60 minutes.   Lonia Farber, MD Pulmonary and Critical Care Medicine [facility] Pager: [phone]  10/01/2012, 7:03 AM

## 2012-10-01 NOTE — Progress Notes (Signed)
PULMONARY  / CRITICAL CARE MEDICINE  Name: Steve [name] MRN: [id number] DOB: [birth year]    ADMISSION DATE:  09/30/2012 CONSULTATION DATE:  10/01/2012  REFERRING MD :  Cardiology PRIMARY SERVICE:  Cardiology  CHIEF COMPLAINT:  Respiratory distress  BRIEF PATIENT DESCRIPTION: 62 yo admitted with acute myocarditis and complete heart block in setting of viral syndrome.  Developed hypoxemia and metabolic acidosis after returning form uncomplicated cardiac catheterization. Intubated.  SIGNIFICANT EVENTS / STUDIES:  9/9    TTE >>> EF 45%, pericardial effusion without tamponade 9/10  Cardiac cath >>> Non-obstructive CAD, transvenous pacer placed  LINES / TUBES: OETT 9/10 >>> OGT 9/10 >>> Foley 9/10 >>> L [place] CVL 9/10 >>> R fem venus sheath >>>  CULTURES: 9/9 Blood >>> 9/9 Urine >>>  ANTIBIOTICS: Zosyn 9/10 >>> Vancomycin 9/10 >>> Levaquin 9/10 >>>  INTERVAL HISTORY:  Acute pulmonary edema.  Respiratory distress.  Intubated.  VITAL SIGNS: Temp:  [97.5 F (36.4 C)-98.9 F (37.2 C)] 98.9 F (37.2 C) (09/10 0800) Pulse Rate:  [26-126] 91 (09/10 0849) Resp:  [12-34] 29 (09/10 0849) BP: (69-124)/(33-96) 69/49 mmHg (09/10 0849) SpO2:  [83 %-100 %] 96 % (09/10 0700) FiO2 (%):  [100 %] 100 % (09/10 0849) Weight:  [102.7 kg (226 lb 6.6 oz)] 102.7 kg (226 lb 6.6 oz) (09/09 1942)  HEMODYNAMICS:   VENTILATOR SETTINGS: Vent Mode:  [-] PRVC FiO2 (%):  [100 %] 100 % Set Rate:  [26 bmp] 26 bmp Vt Set:  [550 mL] 550 mL PEEP:  [5 cmH20] 5 cmH20 Plateau Pressure:  [20 cmH20-26 cmH20] 26 cmH20  INTAKE / OUTPUT: Intake/Output     09/09 0701 - 09/10 0700 09/10 0701 - 09/11 0700   I.V. (mL/kg) 330.3 (3.2)    IV Piggyback 50    Total Intake(mL/kg) 380.3 (3.7)    Urine (mL/kg/hr) 450    Total Output 450     Net -69.7          Urine Occurrence 2 x     PHYSICAL EXAMINATION: General:  Appears acutely ill, mechanically ventilated, synchronous; before intubation - agitated, in acute  distress, with increased work of breathing Neuro:  Encephalopathic, nonfocal, cough / gag diminished HEENT:  PERRL, OETT / OGT Cardiovascular:  RRR, no m/r/g Lungs:  Bilateral diminished air entry, no w/r/r Abdomen:  Soft, nontender, bowel sounds diminished Musculoskeletal:  Moves all extremities, no edema Skin:  Intact  LABS:  Recent Labs Lab 09/30/12 1313 09/30/12 1449 09/30/12 1501 09/30/12 1825 09/30/12 1826 09/30/12 2240 10/01/12 0317 10/01/12 0343 10/01/12 0344 10/01/12 0345 10/01/12 0450 10/01/12 0628  HGB 13.1  --   --   --  12.0*  --   --  12.6*  --   --   --   --   WBC 11.8*  --   --   --  9.0  --   --  12.3*  --   --   --   --   PLT 173  --   --   --  158  --   --  201  --   --   --   --   NA 127* 128*  --   --   --  126*  --  127*  --   --   --   --   K 3.3* 3.2*  --   --   --  3.8  --  4.0  --   --   --   --   CL  89* 88*  --   --   --  90*  --  91*  --   --   --   --   CO2 17* 15*  --   --   --  20  --  19  --   --   --   --   GLUCOSE 145* 145*  --   --   --  145*  --  141*  --   --   --   --   BUN 44* 44*  --   --   --  48*  --  49*  --   --   --   --   CREATININE 2.01* 2.02*  --   --   --  1.73*  --  1.20  --   --   --   --   CALCIUM 8.2* 8.3*  --   --   --  7.9*  --  7.7*  --   --   --   --   MG  --   --   --   --   --  2.0  --  1.9  --   --   --   --   AST 274* 274*  --   --   --   --   --  539*  --   --  546*  --   ALT 83* 82*  --   --   --   --   --  362*  --   --  362*  --   ALKPHOS 41 40  --   --   --   --   --  40  --   --  40  --   BILITOT 0.5 0.4  --   --   --   --   --  0.6  --   --  0.6  --   PROT 6.4 6.3  --   --   --   --   --  5.8*  --   --  6.0  --   ALBUMIN 3.2* 3.1*  --   --   --   --   --  3.0*  --   --  3.0*  --   APTT  --  32  --   --   --   --   --   --   --   --   --   --   INR  --  1.13  --   --   --   --   --   --   --   --   --   --   LATICACIDVEN  --   --  6.58* 2.6*  --   --   --   --  2.8*  --   --   --   TROPONINI  --  >20.00*   --   --   --  >20.00*  --   --   --  >20.00*  --   --   PROCALCITON 0.89  --   --   --   --   --   --  0.83  --   --   --   --   PROBNP  --  28521.0*  --   --   --   --   --   --   --   --   --   --  PHART  --   --   --   --   --   --  7.422  --   --   --   --  7.181*  PCO2ART  --   --   --   --   --   --  25.6*  --   --   --   --  33.7*  PO2ART  --   --   --   --   --   --  74.0*  --   --   --   --  79.0*    Recent Labs Lab 09/30/12 1428  GLUCAP 153*   CXR:  9/10 >>> Hardware in good position, dense bilateral airspace disease  ASSESSMENT / PLAN:  PULMONARY A:  Acute respiratory failure secondary to pulmonary edema.  Less likely pneumonia (CAP vs aspiration). P:   - Goal SpO2>92, pH>7.30. - Full mechanical support, high Ve - F/U ABG. - Diureses as tolerated from a hemodynamic standpoint (hightly doubtful however). - Will increase PEEP if hemodynamics allow for it.  CARDIOVASCULAR A: Acute myocarditis in setting of viral syndrome.  Non-ischemic cardiomyopathy.  Acute systolic CHF.  Cardiogenic shock.  Doubt sepsis / septic shock.  Pericardial effusion without tamponade.  Significant troponin leak in setting of myocarditis vs ACS. P:  - Cardiology taking to the cath lab for IABP and swan placement. - Goal MAP>65. - Otherwise per cards. - Will likely need to be transferred to [facility] (d. benshimon already spoke with [facility]) for cardiogenic shock. - D/c Dopamine given arrhythmia. - Levophed gtt for MAP of 65 mmHg.  Transvenous pacer placed by Cardiology. - F/U post PAC placement to assess hemodynamics.  RENAL A:  AKI. P:   - Goal CVP<8. - Trend BMP. - Hold further lasix until hemodynamics are more stable. - IVF@KVO . - Bicarb drip.  GASTROINTESTINAL A:  Acute liver failure in setting of shock. P:   - NPO as intubated, if not transferring then will start TF. - Continue Protonix for GI Px. - Trend LFT.  HEMATOLOGIC A:  No active issues. P:  - Trend CBC. - D/c Heparin  gtt. - Heparin for DVT Px.  INFECTIOUS A:  Viral myocarditis.  Possible CAP / aspiration pneumonia. P:   - Cultures and antibiotics as above. - Low threshold to d/c abx if cxr improves with diuresis.  ENDOCRINE  A:  Prediabetes. P:   - SSI.  NEUROLOGIC A:  Acute encephalopathy. P:   - Goal RASS 0 to -1. - Fentanyl / Versed gtt.  Spoke with cardiology, feels that cardiogenic shock will likely be refractory, will take to the cath lab and place IABP, if fails then will transfer to [facility].  Once back out of the cath lab then will need further adjustment to the vent and stabilize for transfer pending cardiology's evaluation.  I have personally obtained a history, examined the patient, evaluated laboratory and imaging results, formulated the assessment and plan and placed orders.  CRITICAL CARE:  The patient is critically ill with multiple organ systems failure and requires high complexity decision making for assessment and support, frequent evaluation and titration of therapies, application of advanced monitoring technologies and extensive interpretation of multiple databases. Critical Care Time devoted to patient care services described in this note is 60 minutes.   Koren Bound, MD Pulmonary and Critical Care Medicine [facility] Pager: [phone]  10/01/2012, 9:08 AM

## 2012-10-01 NOTE — OR Nursing (Signed)
Report given to [facility], ECMO Coordinator. [facility] expected to arrive at 1220.

## 2012-10-01 NOTE — Procedures (Signed)
Name: Steve [name] MRN: [id number] DOB: November 24, 1950   PROCEDURE NOTE  Procedure:  Endotracheal intubation.  Indication:  Acute respiratory failure  Consent:  Consent was implied due to the emergency nature of the procedure.  Anesthesia:  Etomidate and Rocuronium  Procedure summary:  Appropriate equipment was assembled. The patient was identified as [employer] and safety timeout was performed. The patient was placed supine, with head in sniffing position. After adequate level of anesthesia was achieved, a GS#3 blade was inserted into the oropharynx and the vocal cords were visualized. A 8.0 endotracheal tube was inserted without difficulty and visualized going through the vocal cords. The stylette was removed and cuff inflated. Colorimetric change was noted on the CO2 meter. Breath sounds were heard over both lung fields equally. ETT was secured at 24 lip line.  Post procedure chest xray was ordered.  Complications:  No immediate complications were noted.  Hemodynamic parameters and oxygenation remained stable throughout the procedure.    Orlean Bradford, M.D. Pulmonary and Critical Care Medicine [facility] Pager: [phone]  10/01/2012, 7:04 AM

## 2012-10-01 NOTE — Progress Notes (Addendum)
ANTICOAGULATION CONSULT NOTE - Initial Consult  Pharmacy Consult for Heparin Indication: chest pain/ACS  Allergies  Allergen Reactions  . Simvastatin     Patient Measurements: Height: 5\' 8"  (172.7 cm) Weight: 226 lb 6.6 oz (102.7 kg) IBW/kg (Calculated) : 68.4 Heparin Dosing Weight: 90 kg  Vital Signs: Temp: 98.1 F (36.7 C) (09/10 0000) Temp src: Oral (09/10 0000) BP: 94/57 mmHg (09/10 0130) Pulse Rate: 60 (09/10 0130)  Labs:  Recent Labs  09/30/12 1313 09/30/12 1449 09/30/12 1826 09/30/12 2240  HGB 13.1  --  12.0*  --   HCT 36.4*  --  34.4*  --   PLT 173  --  158  --   APTT  --  32  --   --   LABPROT  --  14.3  --   --   INR  --  1.13  --   --   CREATININE 2.01* 2.02*  --  1.73*  CKTOTAL  --   --   --  2769*  CKMB  --   --   --  123.5*  TROPONINI  --  >20.00*  --  >20.00*    Estimated Creatinine Clearance: 51.4 ml/min (by C-G formula based on Cr of 1.73).   Medical History: Past Medical History  Diagnosis Date  . Hypertension   . Hyperlipidemia   . Prediabetes   . Cancer     prostate CA s/p seed implant  . Dyslipidemia   . Vitamin D deficiency   . Allergic rhinitis   . Arthritis     OA of knee  . Colon polyps   . Diverticulosis     Medications:  Prescriptions prior to admission  Medication Sig Dispense Refill  . atorvastatin (LIPITOR) 40 MG tablet Take 40 mg by mouth daily.      Marland Kitchen lisinopril-hydrochlorothiazide (PRINZIDE,ZESTORETIC) 20-25 MG per tablet Take 1 tablet by mouth daily.      . naproxen sodium (ANAPROX) 220 MG tablet Take 220 mg by mouth 2 (two) times daily with a meal.        Assessment: 62 yo male with complete heart block, elevated cardiac markers, for heparin  Goal of Therapy:  Heparin level 0.3-0.7 units/ml Monitor platelets by anticoagulation protocol: Yes   Plan:  Heparin 3000 units IV bolus, then 1200 units/hr Check heparin level in 6 hours.   Eilyn Polack, Gary Fleet 10/01/2012,1:46 AM

## 2012-10-01 NOTE — OR Nursing (Signed)
Dr. Teressa Lower notified of [facility]'s arrival per Dr. Zenaida Niece Trigt's request.

## 2012-10-01 NOTE — Progress Notes (Signed)
ANTIBIOTIC CONSULT NOTE - INITIAL  Pharmacy Consult for Vanco/Zosyn/Levaquin Indication: pneumonia  Allergies  Allergen Reactions  . Simvastatin     Patient Measurements: Height: 5\' 8"  (172.7 cm) Weight: 226 lb 6.6 oz (102.7 kg) IBW/kg (Calculated) : 68.4 Adjusted Body Weight:    Vital Signs: Temp: 98.9 F (37.2 C) (09/10 0800) Temp src: Oral (09/10 0800) BP: 69/49 mmHg (09/10 0849) Pulse Rate: 91 (09/10 0849) Intake/Output from previous day: 09/09 0701 - 09/10 0700 In: 380.3 [I.V.:330.3; IV Piggyback:50] Out: 450 [Urine:450] Intake/Output from this shift:    Labs:  Recent Labs  09/30/12 1313 09/30/12 1449 09/30/12 1826 09/30/12 2240 10/01/12 0343  WBC 11.8*  --  9.0  --  12.3*  HGB 13.1  --  12.0*  --  12.6*  PLT 173  --  158  --  201  CREATININE 2.01* 2.02*  --  1.73* 1.20   Estimated Creatinine Clearance: 74.1 ml/min (by C-G formula based on Cr of 1.2). No results found for this basename: VANCOTROUGH, Leodis Binet, VANCORANDOM, GENTTROUGH, GENTPEAK, GENTRANDOM, TOBRATROUGH, TOBRAPEAK, TOBRARND, AMIKACINPEAK, AMIKACINTROU, AMIKACIN,  in the last 72 hours   Microbiology: Recent Results (from the past 720 hour(s))  MRSA PCR SCREENING     Status: None   Collection Time    09/30/12  4:43 PM      Result Value Range Status   MRSA by PCR NEGATIVE  NEGATIVE Final   Comment:            The GeneXpert MRSA Assay (FDA     approved for NASAL specimens     only), is one component of a     comprehensive MRSA colonization     surveillance program. It is not     intended to diagnose MRSA     infection nor to guide or     monitor treatment for     MRSA infections.    Medical History: Past Medical History  Diagnosis Date  . Hypertension   . Hyperlipidemia   . Prediabetes   . Cancer     prostate CA s/p seed implant  . Dyslipidemia   . Vitamin D deficiency   . Allergic rhinitis   . Arthritis     OA of knee  . Colon polyps   . Diverticulosis     Medications:   Prescriptions prior to admission  Medication Sig Dispense Refill  . atorvastatin (LIPITOR) 40 MG tablet Take 40 mg by mouth daily.      Marland Kitchen lisinopril-hydrochlorothiazide (PRINZIDE,ZESTORETIC) 20-25 MG per tablet Take 1 tablet by mouth daily.      . naproxen sodium (ANAPROX) 220 MG tablet Take 220 mg by mouth 2 (two) times daily with a meal.       Assessment: Admitted with weakness, complete heart block from viral illness 9/10: Respiratory distress/metabolic acidosis secondary to pulmonary edema after returning from cath>>VDRF  Anticoagulation:   Infectious Disease: Viral myocarditis. Possible CAP / aspiration pneumonia. LA 6.58, Procalcitonin 0.89.  Cardiovascular HtN HLD, Acute myocarditis in setting of viral syndrome. NICM, acute CHF, cardiogenic shock, pericardial effusion. Elevated enzymes --9/9 TTE >>> EF 45%, pericardial effusion without tamponade  --9/10 Cardiac cath >>> Non-obstructive CAD, transvenous pacer placed, IABP, swan, EF now down to 10% today  Endocrinology:  DM  Gastrointestinal / Nutrition:Vit D def,  LFTs elevated (AST/ALT 539/362 up), IV PPI  Neurology: sedated on the vent  Nephrology: AKI. Metabolic acidosis on bicarb drip (pH 7.18), Na low 127, K=4. Scr down to 1.2  Pulmonary: Intubated  post cath  Hematology / Oncology: h/o prostate cancer s/p seed implant. CBC WNL  PTA Medication Issues  Best Practices  Goal of Therapy:  Vancomycin trough level 15-20 mcg/ml  Plan:  Levaquin 750mg  IV q24h Zosyn 3.375g IV q8hr Vancomycin 2g IV x 1, then 1g IV q12hrs. Trough at steady state  --Adden: Pending orders currently as patient may go to OR for CABG  [name] S. Merilynn Finland, PharmD, BCPS Clinical Staff Pharmacist Pager [phone]  [name] 10/01/2012,9:23 AM

## 2012-10-01 NOTE — Progress Notes (Signed)
Dr Donata Clay at bedside to talk with patients wife, Tarik Teixeira, about ECMO. Consent signed for surgery.   Dawson Bills, RN

## 2012-10-01 NOTE — Anesthesia Preprocedure Evaluation (Addendum)
Anesthesia Evaluation  Patient identified by MRN, date of birth, ID band Patient unresponsive  General Assessment Comment:Patient rushed to OR emergently  Reviewed: Unable to perform ROS - Chart review only  Airway       Dental   Pulmonary          Cardiovascular + Past MI + dysrhythmias     Neuro/Psych    GI/Hepatic   Endo/Other    Renal/GU      Musculoskeletal   Abdominal   Peds  Hematology   Anesthesia Other Findings   Reproductive/Obstetrics                           Anesthesia Physical Anesthesia Plan  ASA: IV and emergent  Anesthesia Plan:    Post-op Pain Management:    Induction:   Airway Management Planned:   Additional Equipment:   Intra-op Plan:   Post-operative Plan: Post-operative intubation/ventilation  Informed Consent:   Plan Discussed with:   Anesthesia Plan Comments:         Anesthesia Quick Evaluation

## 2012-10-01 NOTE — Progress Notes (Signed)
Multiple phone calls with [facility] to coordinate transfer. Bed assignment will be 7W26 at [facility]; Spoke with [facility] CHARGE RN, [place], [phone]) as well. Charge RN requested my number and instructed me to wait on the receiving RN to call for full report.  Dawson Bills, RN

## 2012-10-01 NOTE — Progress Notes (Signed)
Dr. Mayford Knife notified of troponin >20.  Steve [name], Steve [name]

## 2012-10-01 NOTE — Progress Notes (Signed)
Cards fellow paged for SBP treading in low 80s. Orders received to titrate up on dopamine.  [name], Steve [name]

## 2012-10-01 NOTE — Progress Notes (Signed)
eLink Physician-Brief Progress Note Patient Name: Steve [name] DOB: 1950-10-17 MRN: [id number]  Date of Service  10/01/2012   HPI/Events of Note  Continued issues with agitation/anxiety following cath lab procedure.  O2 requirements have increased through the night.  O2 sat monitor not tracking with HR but patient is on NRB mask.  Nurse reports that lung fields sound "junky"  Review of PCXR from this AM shows increasing pulm congestion.   eICU Interventions  Plan: Lasix 80 mg IV times one Additonal ativan 1 mg for anxiety/agitation PCCM to evaluate patient at bedside.    Intervention Category Intermediate Interventions: Respiratory distress - evaluation and management Minor Interventions: Agitation / anxiety - evaluation and management  DETERDING,[name] 10/01/2012, 6:58 AM

## 2012-10-01 NOTE — CV Procedure (Signed)
Cardiac Cath Procedure Note:  Indication:  Cardiogenic shock  Procedures performed:  1) Swan ganz placement with right heart catheterization 2) IABP insertion 3) R femoral artery sheath insertion 4) R femoral vein sheath insertion  5) Attempted DC-CV of VT (unsuccessful)  Description of procedure:   The risks and indication of the procedure were explained. Consent was signed and placed on the chart. An appropriate timeout was taken prior to the procedure. The right neck was prepped and draped in the routine sterile fashion and anesthetized with 1% local lidocaine. Both groins were also prepped/  An 8 FR venous sheath was placed in the right internal jugular vein using a modified Seldinger technique. A standard Swan-Ganz catheter was used for the procedure. He sheath was sewn in place.  A 5FR arterial sheath was place in the left femoral artery. We then did an arteriogram and confirmed placement above the bifurcation. The 5FR sheath was changed over a wire for an 8FR IABP sheath and the IABP was placed in the descending aorta under fluoro guidance.  In anticipation of cardiopulmonary bypass, a 5 FR arterial sheath was placed in the R femoral artery and a 7FR venous sheath was placed in the L femoral vein.  We attempted to overdrive the patient's VT via the TVP with programmed rates up to 200 bpm. This was unsuccessful. We then attempted cardioversion with a single 200J biphasic shock which was also unsuccessful. At the end of the procedure, the patient was taken from the cath to the OR for initiation of cardiopulmonary bypass.   Complications: None apparent.  Findings:  On noriepinephrine 20 mcg/kg/min  RA = 18 RV = 41/14/20 PA = 48/24 (34) PCW = 27  Fick cardiac output/index = 3.8/1.7 PVR = < 1.0 WU FA sat = 99% PA sat = 54%, 56%  Assessment: 1. Cardiogenic shock, rapidly progressive 2. Probable viral myocarditis  Plan/Discussion:  He has had a rapidly progressive course.  Have discussed with Dr. Donata Clay and the [facility] team. Will take to OR for initiation for cardiopulmonary bypass.   Normalee Sistare,MD 10:32 AM

## 2012-10-01 NOTE — Progress Notes (Signed)
INITIAL NUTRITION ASSESSMENT  DOCUMENTATION CODES Per approved criteria  -Obesity Unspecified   INTERVENTION: 1.  Enteral nutrition; initiate Vital HP @ 20 mL/hr continuous.  Advance by 10 mL q 4 hrs to 70 mL/hr goal to provide 1680 kcal, 147g protein, 1404 mL free water.  NUTRITION DIAGNOSIS: Inadequate oral intake related to inability to eat as evidenced by NPO, vent.   Monitor:  1.  Enteral nutrition; initiation with tolerance.  Enteral nutrition to provide 60-70% of estimated calorie needs (22-25 kcals/kg ideal body weight) and 100% of estimated protein needs, based on ASPEN guidelines for permissive underfeeding in critically ill obese individuals. 2.  Wt/wt change; monitor trends  Reason for Assessment: vent  62 y.o. male  Admitting Dx: Syncope  ASSESSMENT: Pt admitted with syncope and complete heart block.  Pt developed worsening respiratory function.  Pt is s/p cardiac cath (9/10), and is currently in cardiac cath at time of visit.  Patient is currently intubated on ventilator support.  MV: 12.6 L/min Temp:Temp (24hrs), Avg:98.1 F (36.7 C), Min:97.5 F (36.4 C), Max:98.9 F (37.2 C)  Propofol: none  Family at bedside state pt has had decreased appetite since Friday due to acute illness but otherwise has been eating per his usual and has not experienced recent wt change.   Height: Ht Readings from Last 1 Encounters:  10/01/12 5\' 8"  (1.727 m)    Weight: Wt Readings from Last 1 Encounters:  09/30/12 226 lb 6.6 oz (102.7 kg)    Ideal Body Weight: 70kg  % Ideal Body Weight: 145%  Wt Readings from Last 10 Encounters:  09/30/12 226 lb 6.6 oz (102.7 kg)  09/30/12 226 lb 6.6 oz (102.7 kg)  09/30/12 226 lb 6.6 oz (102.7 kg)  09/30/12 226 lb 6.6 oz (102.7 kg)    Usual Body Weight: 230 lbs per wife  % Usual Body Weight: 98%  BMI:  Body mass index is 34.43 kg/(m^2).  Estimated Nutritional Needs: Kcal: 1540-1750 Protein: 140g Fluid: ~2.0 L/day  Skin:  generalized edema, intact  Diet Order: Cardiac  EDUCATION NEEDS: -Education not appropriate at this time   Intake/Output Summary (Last 24 hours) at 10/01/12 0849 Last data filed at 10/01/12 0600  Gross per 24 hour  Intake 318.73 ml  Output    450 ml  Net -131.27 ml    Last BM: 9/8  Labs:   Recent Labs Lab 09/30/12 1449 09/30/12 2240 10/01/12 0343  NA 128* 126* 127*  K 3.2* 3.8 4.0  CL 88* 90* 91*  CO2 15* 20 19  BUN 44* 48* 49*  CREATININE 2.02* 1.73* 1.20  CALCIUM 8.3* 7.9* 7.7*  MG  --  2.0 1.9  GLUCOSE 145* 145* 141*    CBG (last 3)   Recent Labs  09/30/12 1428  GLUCAP 153*    Scheduled Meds: . amiodarone (NEXTERONE PREMIX) 360 mg/200 mL dextrose      . amiodarone  150 mg Intravenous Once  . antiseptic oral rinse  1 application Mouth Rinse QID  . aspirin EC  81 mg Oral Daily  . chlorhexidine  15 mL Mouth/Throat BID  . furosemide      . furosemide  40 mg Intravenous Q12H  . furosemide  80 mg Intravenous Once  . heparin subcutaneous  5,000 Units Subcutaneous Q8H  . influenza vac split quadrivalent PF  0.5 mL Intramuscular Tomorrow-1000  . insulin aspart  0-15 Units Subcutaneous Q4H  . pantoprazole (PROTONIX) IV  40 mg Intravenous Q24H  . sodium chloride  3  mL Intravenous Q12H    Continuous Infusions: . amiodarone (NEXTERONE PREMIX) 360 mg/200 mL dextrose    . amiodarone (NEXTERONE PREMIX) 360 mg/200 mL dextrose    . fentaNYL infusion INTRAVENOUS 50 mcg/hr (10/01/12 0749)  . midazolam (VERSED) infusion 4 mg/hr (10/01/12 0750)  . norepinephrine (LEVOPHED) Adult infusion      Past Medical History  Diagnosis Date  . Hypertension   . Hyperlipidemia   . Prediabetes   . Cancer     prostate CA s/p seed implant  . Dyslipidemia   . Vitamin D deficiency   . Allergic rhinitis   . Arthritis     OA of knee  . Colon polyps   . Diverticulosis     Past Surgical History  Procedure Laterality Date  . Knee arthroscopy      Loyce Dys, MS RD  LDN Clinical Inpatient Dietitian Pager: [phone] Weekend/After hours pager: [phone]

## 2012-10-01 NOTE — Progress Notes (Signed)
Elink paged. Pt junky lung fields. desating on non-rebreather. ABG ordered. Dr. Herma Carson requested. Will continue to monitor.  [name], Steve [name]

## 2012-10-01 NOTE — Progress Notes (Signed)
Dr Herma Carson at bedside. Preparing to intubate.  Steve [name], Steve [name]

## 2012-10-01 NOTE — CV Procedure (Signed)
    Cardiac Catheterization Operative Report  Steve [name] [id number] 9/10/20145:15 AM Sissy Hoff, MD  Procedure Performed:  1. Left Heart Catheterization 2. Selective Coronary Angiography  Operator: Verne Carrow, MD  Arterial access site:  Right radial artery.   Indication:   62 yo male with history of HTN, HLD, DM, former tobacco abuse admitted with complete heart block. Ruled in for MI with troponin greater than 20. Clinical picture c/w myopericarditis with recent fever, cough, chills, sinus congestion. Renal function and LFTs also abnormal. Temporary pacemaker placed on admission 14 hours ago. Overnight, increased O2 requirement. With elevated enzymes, CHB and decreased LVEF, we decided to perform urgent cardiac cath.                                   Procedure Details: The risks, benefits, complications, treatment options, and expected outcomes were discussed with the patient. The patient and/or family concurred with the proposed plan, giving informed consent.  The patient was not sedated. The right wrist was assessed with an Allens test which was positive. The right wrist was prepped and draped in a sterile fashion. 1% lidocaine was used for local anesthesia. Using the modified Seldinger access technique, a 5 French sheath was placed in the right radial artery. 3 mg Verapamil was given through the sheath. No heparin was given as he had been on a heparin drip all night. This was stopped on arrival to the cath lab. Standard diagnostic catheters were used to perform selective coronary angiography. A pigtail catheter was used to measure LV pressures. The sheath was removed from the right radial artery and a Terumo hemostasis band was applied at the arteriotomy site on the right wrist.    There were no immediate complications. The patient was taken to the recovery area in stable condition.   Hemodynamic Findings: Central aortic pressure: 92/67 Left ventricular pressure:  89/24/34  Angiographic Findings:  Left main: No obstructive disease.   Left Anterior Descending Artery: Large caliber vessel that courses to the apex. There is a mild plaque in the proximal and mid LAD. The distal LAD has a 30% stenosis before the vessel reaches the apex. The diagonal branch is moderate in caliber with no obstructive disease.   Circumflex Artery: Large, caliber co-dominant vessel with early intermediate branch, several small OM branches followed by a moderate caliber OM branch and a left sided posterolateral branch. Mild plaque noted in the AV groove Circumflex. No obstructive disease.   Right Coronary Artery: Moderate caliber co-dominant vessel with mild plaque in the proximal and mid vessel. The distal vessel has a 50% stenosis which does not appear to be flow limiting. The RCA supplies a small PDA.   Left Ventricular Angiogram: Deferred.   Impression: 1. Non-obstructive CAD 2. Shock secondary to possible myopericarditis. Currently stable hemodynamically on dopamine.   Recommendations: Continue current plan of supportive care in the CCU.        Complications:  None. The patient tolerated the procedure well.

## 2012-10-01 NOTE — Preoperative (Signed)
Beta Blockers   Reason not to administer Beta Blockers:Not Applicable 

## 2012-10-01 NOTE — Transfer of Care (Signed)
Immediate Anesthesia Transfer of Care Note  Patient: Steve [name]  Procedure(s) Performed: Procedure(s): CANNULATION FOR VENO-ARTERIAL ECMO (N/A) INTRAOPERATIVE TRANSESOPHAGEAL ECHOCARDIOGRAM (N/A)  Patient Location: [facility] transport team  Anesthesia Type:General  Level of Consciousness: unresponsive  Airway & Oxygen Therapy: Patient remains intubated per anesthesia plan  Post-op Assessment: Post -op Vital signs reviewed and stable  Post vital signs: Reviewed  Complications: No apparent anesthesia complications

## 2012-10-01 NOTE — Consult Note (Signed)
ADVANCED HEART FAILURE TEAM CONSULT NOTE   Patient ID: Steve [name] MRN: [id number], DOB/AGE: Oct 30, 1950   Admit date: 09/30/2012 Date of Consult: 10/01/2012  Primary Physician: Sissy Hoff, MD Primary Cardiologist: Armanda Magic, MD Reason for Consultation: Cardiogenic shock  History of Present Illness Steve [name] is a 62 y.o. male with HTN, dyslipidemia, DM and prostate CA s/p seed implant who presented to [facility] ED yesterday with syncope and URI symptoms. He is currently intubated and sedated, unable to provide history. His history is obtained from his chart. Apparently he began feeling sick on Saturday with symptoms of body aches, sneezing, coughing and fever. He was evaluated at an urgent care center on Sunday at which time he was told he has a cold. He felt worse and developed generalized weakness. His wife reports he experienced intermittent unresponsiveness at home yesterday which prompted her to schedule an appointment with his PCP. He had recurrent syncope at Dr. Merita Norton office and EMS was called. He was found to be in complete heart block with a ventricular escape rhythm in the 20s. On arrival to the ED he was hypotensive with a SBP of 75 mmHg and cyanotic appearing. He denied chest pain or SOB. He was afebrile on admission. He was started on IV dopamine for pressure support and blood cultures were obtained. He went emergently for temporary pacemaker placement. Troponin >20 and proBNP significantly elevated. Lactate 7 An echo was done which revealed LVEF 45% without regional WMAs. He was taken back to the cath lab for definitive evaluation of his coronary anatomy. This revealed non-obstructive CAD. LVEDP ~34 He remained hypoxic through the evening and around 7 AM today developed acute respiratory failure requiring intubation. He then developed VT at 120 bpm. Amio started but became hypotensive with SBPs 60s. Levophed started.   Repeat echo this am showed severe biventricular dysfunction with  EF 15-20%.  No recent travels, not tick bites or other exposures. No rash or joint issues.   Past Medical History Past Medical History  Diagnosis Date  . Hypertension   . Hyperlipidemia   . Prediabetes   . Cancer     prostate CA s/p seed implant  . Dyslipidemia   . Vitamin D deficiency   . Allergic rhinitis   . Arthritis     OA of knee  . Colon polyps   . Diverticulosis     Past Surgical History Past Surgical History  Procedure Laterality Date  . Knee arthroscopy      Allergies/Intolerances Allergies  Allergen Reactions  . Simvastatin     Current Home Medications No prescriptions prior to admission    Inpatient Medications . [START ON 10/02/2012] aminocaproic acid (AMICAR) for OHS   Intravenous To OR  . [facility] HOLD] amiodarone  150 mg Intravenous Once  . [facility] HOLD] antiseptic oral rinse  1 application Mouth Rinse QID  . [facility] HOLD] aspirin EC  81 mg Oral Daily  . [START ON 10/02/2012] cefUROXime (ZINACEF)  IV  750 mg Intravenous To OR  . [facility] HOLD] chlorhexidine  15 mL Mouth/Throat BID  . [START ON 10/02/2012] dexmedetomidine  0.1-0.7 mcg/kg/hr Intravenous To OR  . dexmedetomidine  0.1-0.7 mcg/kg/hr Intravenous To OR  . [START ON 10/02/2012] DOPamine  2-20 mcg/kg/min Intravenous To OR  . DOPamine  2-20 mcg/kg/min Intravenous To OR  . epinephrine  0.5-20 mcg/min Intravenous To OR  . fentaNYL infusion INTRAVENOUS  25-400 mcg/hr Intravenous To OR  . [facility] HOLD] furosemide  40 mg Intravenous Q12H  . [facility]  HOLD] furosemide  80 mg Intravenous Once  . [START ON 10/02/2012] heparin 30,000 units/NS 1000 mL solution for CELLSAVER   Other To OR  . [facility] HOLD] heparin subcutaneous  5,000 Units Subcutaneous Q8H  . [[facility] HOLD] influenza vac split quadrivalent PF  0.5 mL Intramuscular Tomorrow-1000  . [facility] HOLD] insulin aspart  0-15 Units Subcutaneous Q4H  . [START ON 10/02/2012] insulin (NOVOLIN-R) infusion   Intravenous To OR  . insulin (NOVOLIN-R) infusion   Intravenous To OR  . [START  ON 10/02/2012] magnesium sulfate  40 mEq Other To OR  . midazolam (VERSED) infusion  1-2 mg/hr Intravenous To OR  . [START ON 10/02/2012] nitroGLYCERIN  2-200 mcg/min Intravenous To OR  . norepinephrine (LEVOPHED) Adult infusion  2-50 mcg/min Intravenous To OR  . [[facility] HOLD] pantoprazole (PROTONIX) IV  40 mg Intravenous Q24H  . [START ON 10/02/2012] phenylephrine (NEO-SYNEPHRINE) Adult infusion  30-200 mcg/min Intravenous To OR  . [START ON 10/02/2012] potassium chloride  80 mEq Other To OR  . [facility] HOLD] sodium chloride  3 mL Intravenous Q12H   . amiodarone (NEXTERONE PREMIX) 360 mg/200 mL dextrose    . [[facility] HOLD] DOBUTamine    . [facility] HOLD] fentaNYL infusion INTRAVENOUS 50 mcg/hr (10/01/12 0749)  . [facility] HOLD] midazolam (VERSED) infusion 4 mg/hr (10/01/12 0750)  . milrinone    . [[facility] HOLD] norepinephrine (LEVOPHED) Adult infusion    . [[facility] HOLD] norepinephrine (LEVOPHED) Adult infusion      Family History History reviewed. No pertinent family history.   Social History Social History  . Marital Status: Married   Social History Main Topics  . Smoking status: Former Smoker -- 0.50 packs/day for 25 years    Types: Cigarettes    Quit date: 09/09/2012  . Smokeless tobacco: Never Used  . Alcohol Use: No  . Drug Use: No   Review of Systems As per HPI; Unable to obtain otherwise as patient is intubated and sedated  Physical Exam  Vitals: Blood pressure 69/49, pulse 91, temperature 98.9 F (37.2 C), temperature source Oral, resp. rate 29, height 5\' 8"  (1.727 m), weight 102.7 kg (226 lb 6.6 oz), SpO2 96.00%.  General: , ill appearing 62 y.o. male intubated and sedated. HEENT: Normocephalic, atraumatic. ET tube in place. Neck: Supple. JVD elevated Lungs: Mechanical ventilation. Coarse breath sounds throughout.  Heart: tachy Regular. +s3 Abdomen: Soft, +distended. BS present x 4 quadrants. No hepatosplenomegaly.  Extremities: No clubbing, cyanosis or edema.  cool Neuro: Deferred. Patient  intubated and sedated. Skin: Intact. cold and dry. No rashes or petechiae in exposed areas.   Labs  Recent Labs  09/30/12 1449 09/30/12 2240 10/01/12 0345  CKTOTAL  --  2769* 2550*  CKMB  --  123.5* 111.1*  TROPONINI >20.00* >20.00* >20.00*   Lab Results  Component Value Date   WBC 12.3* 10/01/2012   HGB 8.2* 10/01/2012   HCT 24.0* 10/01/2012   MCV 86.5 10/01/2012   PLT 201 10/01/2012    Recent Labs Lab 10/01/12 0343 10/01/12 0450  10/01/12 1244  NA 127*  --   < > 135  K 4.0  --   < > 3.1*  CL 91*  --   --   --   CO2 19  --   --   --   BUN 49*  --   --   --   CREATININE 1.20  --   --   --   CALCIUM 7.7*  --   --   --  PROT 5.8* 6.0  --   --   BILITOT 0.6 0.6  --   --   ALKPHOS 40 40  --   --   ALT 362* 362*  --   --   AST 539* 546*  --   --   GLUCOSE 141*  --   < > 177*  < > = values in this interval not displayed. ProBNP - 28,521 Magnesium - 2.0 Lactic acid - 6.58  Recent Labs  09/30/12 1449  TSH 3.878    Recent Labs  09/30/12 1449  INR 1.13    Radiology/Studies Dg Chest Port 1 View 10/01/2012   IMPRESSION: Cardiac enlargement with increasing pulmonary vascular congestion since previous study.  There is also development of perihilar and interstitial infiltration.  Changes most likely represent edema. Pneumonia not excluded.    Original Report Authenticated By: Burman Nieves, M.D.   Dg Chest Port 1 View 09/30/2012   IMPRESSION: Borderline cardiomegaly.  Central mild vascular congestion without convincing pulmonary edema.  No active disease.    Original Report Authenticated By: Natasha Mead, M.D.   Echocardiogram  Study Conclusions - Left ventricle: The cavity size was normal. The estimated ejection fraction was 45%. Wall motion was normal; there were no regional wall motion abnormalities. - Mitral valve: Mild regurgitation. - Left atrium: The atrium was mildly dilated. - Pericardium, extracardiac: A small, free-flowing pericardial effusion was  identified anterior to the heart. There was no evidence of hemodynamic compromise.  Cardiac catheterization Hemodynamic Findings:  Central aortic pressure: 92/67  Left ventricular pressure: 89/24/34  Angiographic Findings:  Left main: No obstructive disease.  Left Anterior Descending Artery: Large caliber vessel that courses to the apex. There is a mild plaque in the proximal and mid LAD. The distal LAD has a 30% stenosis before the vessel reaches the apex. The diagonal branch is moderate in caliber with no obstructive disease.  Circumflex Artery: Large, caliber co-dominant vessel with early intermediate branch, several small OM branches followed by a moderate caliber OM branch and a left sided posterolateral branch. Mild plaque noted in the AV groove Circumflex. No obstructive disease.  Right Coronary Artery: Moderate caliber co-dominant vessel with mild plaque in the proximal and mid vessel. The distal vessel has a 50% stenosis which does not appear to be flow limiting. The RCA supplies a small PDA.  Left Ventricular Angiogram: Deferred.  Impression:  1. Non-obstructive CAD  2. Shock secondary to possible myopericarditis. Currently stable hemodynamically on dopamine.    ECGs described below   Assessment and Plan  1. Cardiogenic shock 2. 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 Complete heart block on presentation with wide QRS escape 4. VT  He is critically ill with rapidly deteriorating cardiogenic shock in setting of probable viral myocarditis. Will take to cath lab for [facility] and IABP. Will attempt to overdrive pace VT vs DC-CV. Start amio as BP tolerates. Will likely need ECMO. [facility] CCU contacted. Dr. Donata Clay aware. Doubt giant cell given viral prodrome so will not give steroids. Viral serologies and ANA sent. Full consult pending. Wife updated.   Critical Care Time 90 minutes.  Truman Hayward

## 2012-10-01 NOTE — Progress Notes (Signed)
Cards fellow informed of pt worsening respiratory status. Pt desated with a good wave to the 70s/80s. Pt did not appear to be in acute distress. Looked comfortable. Oxygen requirements increasing. Also of temp pacer failure to capture, sense and pace. Fellow came to pts bedside. Changes to pacemaker were made. mAs increased to 10 and rate increased to 70bpm. 3 EKGs done in MDs presence which he reviewed. Will continue to monitor.  Perkins, [place] [name]

## 2012-10-01 NOTE — Progress Notes (Signed)
Pt's HR in 120-130's with a wide complex. Dr Marin Shutter and Dr Mayford Knife are aware and at bedside; STAT ekg and STAT 2D echo ordered.

## 2012-10-01 NOTE — Progress Notes (Signed)
Pt remains anxious, agitated, restless. Tachycardic 150s. Elink re-paged. Orders received for another IV ativan.  Perkins, [place] [name]

## 2012-10-01 NOTE — Procedures (Signed)
Name:  Kaipo Ardis MRN:  [id number] DOB:  06-25-1950  PROCEDURE NOTE  Procedure:  Central venous catheter placement.  Indications:  Need for intravenous access and hemodynamic monitoring.  Consent:  Consent was implied due to the emergency nature of the procedure.  Anesthesia:  A total of 10 mL of 1% Lidocaine was used for local infiltration anesthesia.  Procedure summary:  Appropriate equipment was assembled.  The patient was identified as [employer] and safety timeout was performed. The patient was placed in Trendelenburg position.  Sterile technique was used. The patient's left anterior chest wall was prepped using chlorhexidine / alcohol scrub and the field was draped in usual sterile fashion with full body drape. After the adequate anesthesia was achieved, the left subclavian vein was cannulated with the introducer needle without difficulty. A guide wire was advanced through the introducer needle, which was then withdrawn. A small skin incision was made at the point of wire entry, the dilator was inserted over the guide wire and appropriate dilation was obtained. The dilator was removed and triple-lumen catheter was advanced over the guide wire, which was then removed.  All ports were aspirated and flushed with normal saline without difficulty. The catheter was secured into place. Antibiotic patch was placed and sterile dressing was applied. Post-procedure chest x-ray was ordered.  Complications:  No immediate complications were noted.  Hemodynamic parameters and oxygenation remained stable throughout the procedure.  Estimated blood loss:  Less then 5 mL.  Lonia Farber, MD Pulmonary and Critical Care Medicine [facility] Cell: [phone]  10/01/2012, 7:06 AM

## 2012-10-01 NOTE — Progress Notes (Signed)
Pt anxious, agitated, restless, seeing things. elink notified. Orders received for IV ativan.  Perkins, [place] [name]

## 2012-10-01 NOTE — Progress Notes (Signed)
Called by moonlighting fellow this am.  Pt exhibited worsening hypoxia over last two hours. No chest pain. He denies dyspnea. Chart reviewed. Troponin >20 this afternoon after admission. EKG without ST elevation. Pt felt to have myopericarditis with reduction in LVEF and clinical picture c/w this. Upon presentation yesterday afternoon he was in complete heart block. Temporary pacemaker placed in the afternoon. With change in clinical status overnight, will plan emergent cardiac cath to exclude obstructive CAD. He has been on a heparin drip. Cath reviewed with pt and wife and they agree to proceed. Of note, renal function is abnormal. Will limit contrast load.   Steve [name] 4:41 AM 10/01/2012

## 2012-10-01 NOTE — Consult Note (Signed)
  62 y/o male with prostate CA and HTN. Admitted with shock and respiratory failure in the setting of a several day h/o of viral symtpoms.  Found to have myocarditis. Cath last night with relatively normal cors. EF 40%. Then developed CHB and TVP placed last night.  This morning with worsening hypotension and VT on monitor. EF 15% by bedside echo. Trop > 20. CK > 2550/1100.   HF Consult called.  Assessment: 1) Cardiogenic shock 2) Probable viral myocarditis 3) VT 4) CHB s/p TVP  Plan/Discussion:  He is critically ill with rapidly deteriorating cardiogenic shock in setting of probable viral myocarditis. Will take to cath lab for [facility] and IABP. Will attempt to overdrive pace VT vs DC-CV. Start amio as BP tolerates. Will likely need ECMO. [facility] CCU contacted. Dr. Donata Clay aware. Doubt giant cell given viral prodrome so will not give steroids. Viral serologies and ANA sent. Full consult pending. Wife updated.  Critical Care Time 90 minutes.  Daniel Bensimhon,MD 10:18 AM

## 2012-10-02 ENCOUNTER — Telehealth (HOSPITAL_COMMUNITY): Payer: Self-pay | Admitting: Cardiology

## 2012-10-02 MED FILL — Electrolyte-R (PH 7.4) Solution: INTRAVENOUS | Qty: 3000 | Status: AC

## 2012-10-02 MED FILL — Sodium Bicarbonate IV Soln 8.4%: INTRAVENOUS | Qty: 150 | Status: AC

## 2012-10-02 MED FILL — Sodium Bicarbonate IV Soln 8.4%: INTRAVENOUS | Qty: 50 | Status: AC

## 2012-10-02 MED FILL — Sodium Chloride Irrigation Soln 0.9%: Qty: 3000 | Status: AC

## 2012-10-02 MED FILL — Mannitol IV Soln 20%: INTRAVENOUS | Qty: 500 | Status: AC

## 2012-10-02 MED FILL — Heparin Sodium (Porcine) Inj 1000 Unit/ML: INTRAMUSCULAR | Qty: 10 | Status: AC

## 2012-10-02 NOTE — Telephone Encounter (Signed)
Called with a critical lab report pts blood cx done 9/9-  aerobic gram + rods

## 2012-10-02 NOTE — Telephone Encounter (Signed)
Pt was transferred to [facility], Dr Gala Romney aware

## 2012-10-02 NOTE — Op Note (Signed)
NAME:  Steve [name], Steve [name]                ACCOUNT NO.:  [id number]  MEDICAL RECORD NO.:  [id number]  LOCATION:  [facility]                         FACILITY:  [facility]  PHYSICIAN:  Kerin Perna, M.D.  DATE OF BIRTH:  09-04-1950  DATE OF PROCEDURE: DATE OF DISCHARGE:                              OPERATIVE REPORT   PROCEDURE: 1. Cannulation for veno-arterial ECMO. 2. Placement of left brachial artery A-line for blood pressure     monitoring.  PREOPERATIVE DIAGNOSIS:  Acute myocarditis with cardiogenic shock.  POSTOPERATIVE DIAGNOSIS:  Acute myocarditis with cardiogenic shock.  SURGEON:  Kerin Perna, M.D.  ANESTHESIA:  General by Dr. Ernie Avena.  INDICATIONS:  The patient is a 62 year old Caucasian male, who was admitted to the hospital within the past 18 hours with heart block, elevated cardiac enzymes, and progressive deteriorations in ventricular function leading to severe cardiogenic shock.  This morning after a cardiac catheterization previously showed no significant coronary artery disease, he was intubated and brought to the cath lab for placement of a pulmonary artery catheter and balloon pump placement.  His hemodynamics were consistent with cardiogenic shock and a balloon pump was placed, however his augmented blood pressure was only 60 on pressors.  His chest x-ray showed severe bilateral pulmonary edema and he was acidotic. Emergency consultation of Thoracic Surgery for mechanical circulatory support was placed.  I examined the patient in the cardiac cath lab as an emergency consultation and discussed the patient after examining him with Dr. Gala Romney.  I recommended placement on ECMO with a bridge using cardiopulmonary bypass until the ECMO transport team from [facility] arrived for transport back to [place].  This plan was discussed with the surgical director of ECMO at [facility], and that institution agreed to accept the patient in transfer for  prolonged advanced mechanical circulatory support for his cardiogenic shock.  I also reviewed the situation with the patient's wife and discussed the indications for taking him to the OR for femoral venoarterial cannulation for cardiopulmonary bypass as a bridge to long-term ECMO support.  She understood the risks of sudden death, bleeding, arrhythmias.  She agreed to proceed with surgery under informed consent.  OPERATIVE PROCEDURE:  The patient was brought directly from the CCU to the operating room, being ventilated, maintained on a balloon pump, and receiving pressors including high-dose norepinephrine as well as moderate-dose epinephrine.  The patient was placed supine on the operating table.  A transesophageal echo probe was placed which showed severe LV dysfunction with EF of 15%.  PA pressures and CVP were elevated.  The patient was acidotic.  The patient was prepped and draped as a sterile field.  A guidewire was passed through a right femoral A-line.  Over the guidewire, dilators were then passed and a 18-French Edwards arterial catheter was positioned under fluoroscopy into the iliac artery on the right side with good flow.  The cannula was clamped and connected to the arterial inflow line from cardiopulmonary bypass circuit.  The cannula was secured to the skin with multiple sutures.  Next, a venous introducer in the left femoral vein was used to pass a guidewire using fluoroscopy up to the SVC, and small  incision was made over the guidewire, and over the guidewire, several dilators were passed.  Ultimately, an Estech 23-25-French two-stage venous cannula was passed up the left femoral vein under fluoroscopic guidance into the SVC extending down to the IVC.  It was clamped and flushed and connected to the outflow venous cannula of the cardiopulmonary bypass circuit. Clamps were removed and the patient was then placed on cardiopulmonary bypass.  The ventilator was left on  a low-level support and the patient's pressors were reduced.  He had good flow of 3.7-3.9 L/minute with mean pressure of 60-70 mmHg.  A balloon pump was left in augmentation one to one.  Both cannulas were secured to the skin with multiples silk sutures.  At this point, a new sterile field was made over the left arm, and under sterile ultrasound guidance, the left brachial artery was cannulated using the Seldinger technique and a catheter was placed for arterial blood pressure monitoring and sampling.  This was flushed and secured to the skin with silk suture.  When the [facility] ECMO transport team arrived, the patient had been stable for an hour and 45 minutes on cardiopulmonary bypass.  ACT was checked again to document therapeutic level of heparin.  Both the arterial and venous cannulas were then clamped, divided, and connected to the [employer] transport ECMO circuit.  This was performed very smoothly and immediately flows 3.5-3.7 L/minute were achieved with stable mean blood pressure.  The patient was then prepared for transport by the [facility] ECMO transport team including the balloon pump catheter, the ECMO cannulas, the temporary pacing wire, and all the multiple drips as well as the ventilator.  The patient was transported out of the operating room approximately an hour after transitioning from cardiopulmonary bypass to ECMO in hemodynamically stable condition.  BLOOD LOSS:  Minimal.     Kerin Perna, M.D.     PV/MEDQ  D:  10/01/2012  T:  10/01/2012  Job:  [id number]

## 2012-10-02 NOTE — Op Note (Signed)
NAME:  SHIELDS, PAUTZ                ACCOUNT NO.:  [id number]  MEDICAL RECORD NO.:  [id number]  LOCATION:  [facility]                         FACILITY:  [facility]  PHYSICIAN:  Burna Forts, M.D.DATE OF BIRTH:  28-May-1950  DATE OF PROCEDURE:  10/01/2012 DATE OF DISCHARGE:                              OPERATIVE REPORT   PROCEDURE:  Intraoperative transesophageal echocardiogram.  INDICATION FOR PROCEDURE:  Mr. Steve [name] is a 62 year old gentleman who was rushed emergently from the cath lab to the operating room for venoarterial cannulation and cardiopulmonary bypass in preparation for ECMO and transfer to [facility].  The patient has a history of apparently viral myocarditis which has left him with heart failure, respiratory failure.  He is intubated, ventilated, and sedated and comes to the operating room in severe distress and on a balloon pump.  We are asked to place a TEE probe for evaluation of cardiac structures and function.  On arrival, the TEE probe was prepared and passed oropharyngeally into the stomach and withdrawn slightly for imaging of the cardiac structures.  Left ventricle:  The left ventricular chamber is seen initially in the short axis view.  It is severely depressed left ventricular chamber in its form and function.  There was an estimated ejection fraction of only 15-20%.  There is a small posterior effusion noted.  Attention to the LV walls reveal essentially akinetic anterior septal and inferior walls.  There is severe hypokinesis of the lateral wall.  There is minimal contractility noted.  Again, there is severely depressed left ventricular chamber.  Mitral valve:  Thin compliant mobile mitral valve apparatus apparently normal in structure and function.  Aortic valve:  The aortic valve is trileaflet.  Multiple views, both long and short axis, revealed there is no aortic insufficiency appreciated and certainly no obstruction to flow.  Left and right atrial  chambers are essentially normal.  Right ventricle:  The right ventricular chamber is viewed and assessed. It is fairly normal contractile pattern appreciated at this time.  Tricuspid valve:  Normal thin, compliant valve.  The surgery is carried out by Dr. Kathlee Nations Trigt to place the patient on venoarterial bypass, awaiting transfer to [facility] for ECMO.          ______________________________ Burna Forts, M.D.     JTM/MEDQ  D:  10/01/2012  T:  10/01/2012  Job:  [id number]

## 2012-10-03 ENCOUNTER — Encounter (HOSPITAL_COMMUNITY): Payer: Self-pay | Admitting: Cardiothoracic Surgery

## 2012-10-03 LAB — CULTURE, BLOOD (ROUTINE X 2)

## 2012-10-07 LAB — CULTURE, BLOOD (ROUTINE X 2)

## 2012-10-22 DEATH — deceased

## 2013-12-31 ENCOUNTER — Encounter (HOSPITAL_COMMUNITY): Payer: Self-pay | Admitting: Cardiovascular Disease

## 2015-09-08 IMAGING — CR DG CHEST 1V PORT
1 series · 1 of 1 positions shown · non-contrast
Comparison: Portable chest x-ray of 10/01/2012

CLINICAL DATA: Evaluate central line placement and endotracheal
tube placement

PORTABLE CHEST - 1 VIEW

[AP]
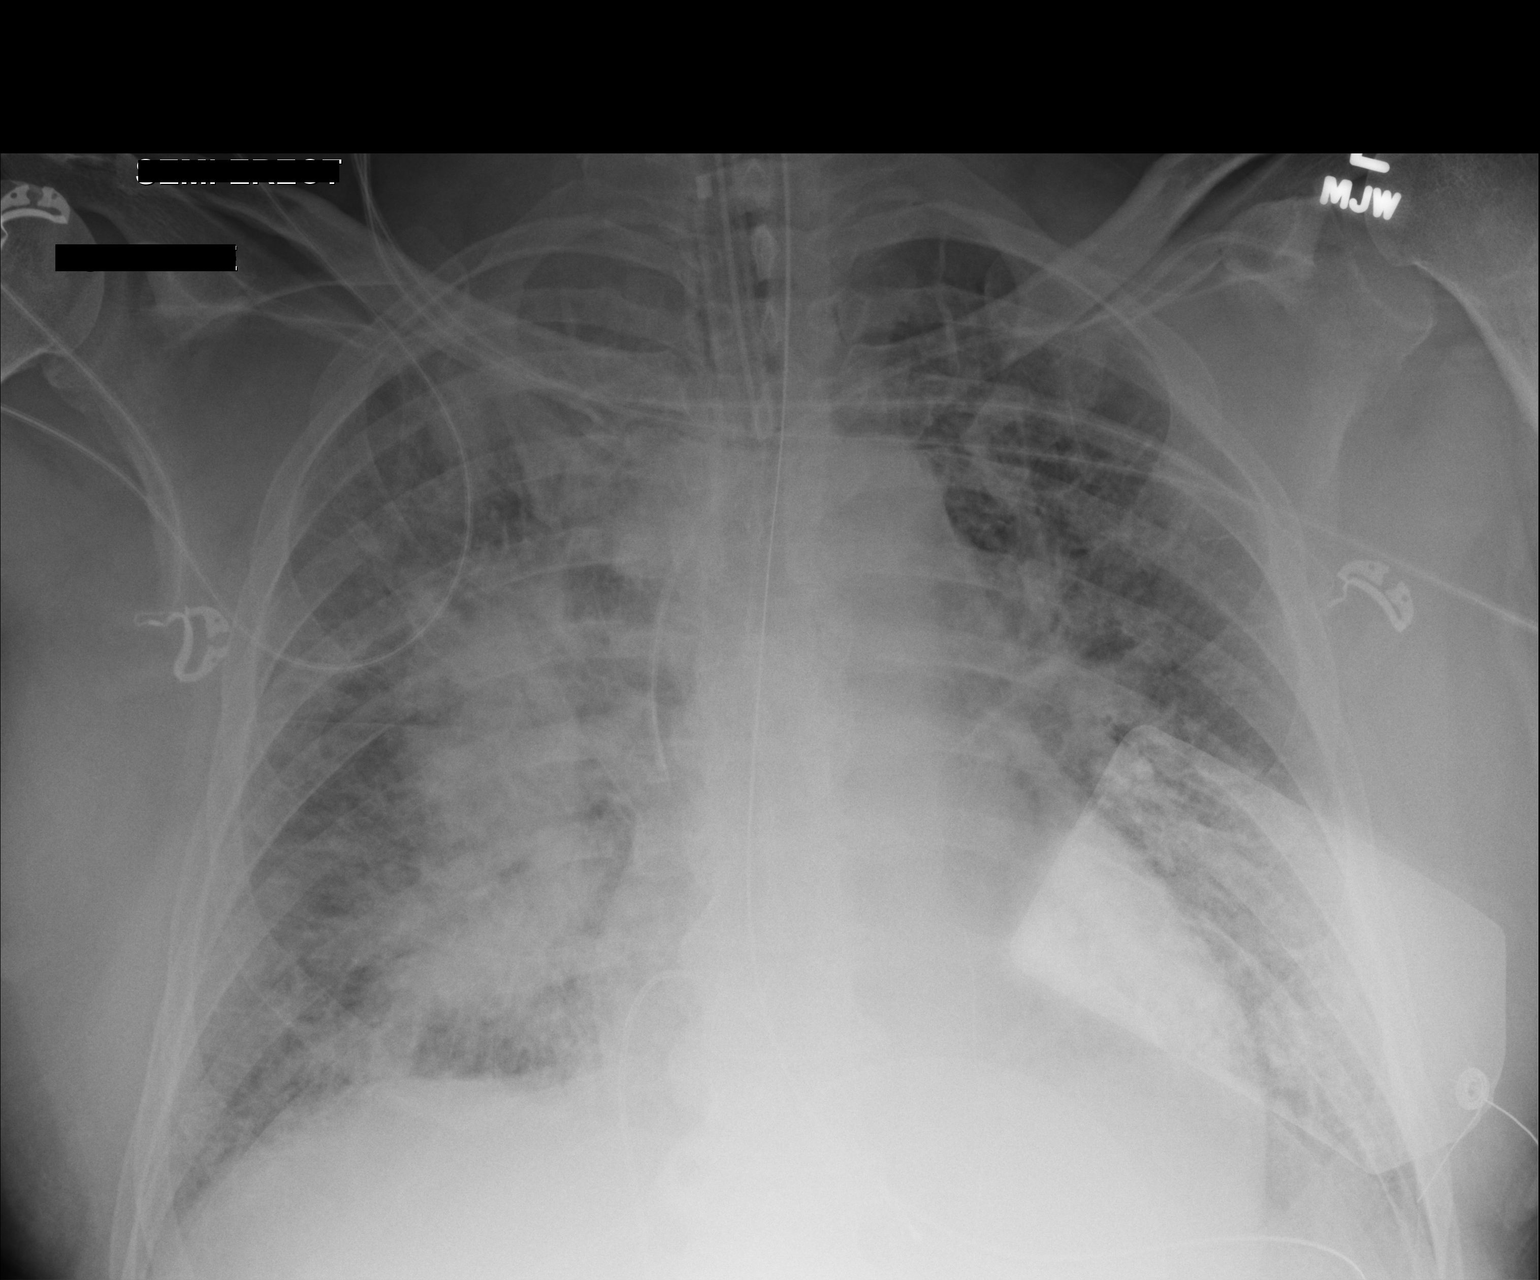

[1 of 1 positions shown; findings below may reference images not displayed]

FINDINGS: The tip of the endotracheal.  Is approximately 5.1 cm
above the carina.  A left central venous line is noted with the tip
overlying the lower SVC.  There has been worsening of probable
pneumonia in the right mid lung field as well as possible
superimposed interstitial edema.  Any follow-up is recommended.  No
definite effusion is seen.
IMPRESSION: 1.  Tip of endotracheal tube 5.1 cm above the carina.
2.  Left central venous line tip overlies the lower SVC.
3.  Worsening of opacity in the right mid and lower lung field most
consistent with pneumonia.
4.  Suspect overlapping interstitial edema.
